# Patient Record
Sex: Female | Born: 1955 | Race: White | Hispanic: No | Smoking: Current every day smoker
Health system: Southern US, Community
[De-identification: ages and names within clinical notes are randomized; demographics above are authoritative.]

## PROBLEM LIST (undated history)

## (undated) DIAGNOSIS — F3160 Bipolar disorder, current episode mixed, unspecified: Secondary | ICD-10-CM

## (undated) DIAGNOSIS — M5412 Radiculopathy, cervical region: Secondary | ICD-10-CM

## (undated) DIAGNOSIS — F329 Major depressive disorder, single episode, unspecified: Secondary | ICD-10-CM

## (undated) DIAGNOSIS — M1990 Unspecified osteoarthritis, unspecified site: Secondary | ICD-10-CM

## (undated) DIAGNOSIS — F32A Depression, unspecified: Secondary | ICD-10-CM

## (undated) DIAGNOSIS — M5090 Cervical disc disorder, unspecified, unspecified cervical region: Secondary | ICD-10-CM

## (undated) DIAGNOSIS — F4010 Social phobia, unspecified: Secondary | ICD-10-CM

## (undated) DIAGNOSIS — G4700 Insomnia, unspecified: Secondary | ICD-10-CM

## (undated) DIAGNOSIS — M48061 Spinal stenosis, lumbar region without neurogenic claudication: Secondary | ICD-10-CM

## (undated) DIAGNOSIS — Z62810 Personal history of physical and sexual abuse in childhood: Secondary | ICD-10-CM

## (undated) DIAGNOSIS — I639 Cerebral infarction, unspecified: Secondary | ICD-10-CM

## (undated) DIAGNOSIS — J449 Chronic obstructive pulmonary disease, unspecified: Secondary | ICD-10-CM

## (undated) DIAGNOSIS — I10 Essential (primary) hypertension: Secondary | ICD-10-CM

## (undated) DIAGNOSIS — F1911 Other psychoactive substance abuse, in remission: Secondary | ICD-10-CM

## (undated) DIAGNOSIS — G43909 Migraine, unspecified, not intractable, without status migrainosus: Secondary | ICD-10-CM

## (undated) HISTORY — PX: TUBAL LIGATION: SHX77

## (undated) HISTORY — DX: Bipolar disorder, current episode mixed, unspecified: F31.60

## (undated) HISTORY — DX: Unspecified osteoarthritis, unspecified site: M19.90

## (undated) HISTORY — PX: NASAL SINUS SURGERY: SHX719

## (undated) HISTORY — DX: Chronic obstructive pulmonary disease, unspecified: J44.9

## (undated) HISTORY — DX: Cerebral infarction, unspecified: I63.9

## (undated) HISTORY — DX: Migraine, unspecified, not intractable, without status migrainosus: G43.909

## (undated) HISTORY — DX: Insomnia, unspecified: G47.00

## (undated) HISTORY — DX: Other psychoactive substance abuse, in remission: F19.11

## (undated) HISTORY — DX: Essential (primary) hypertension: I10

## (undated) HISTORY — DX: Cervical disc disorder, unspecified, unspecified cervical region: M50.90

## (undated) HISTORY — DX: Radiculopathy, cervical region: M54.12

## (undated) HISTORY — PX: ECTOPIC PREGNANCY SURGERY: SHX613

## (undated) HISTORY — DX: Personal history of physical and sexual abuse in childhood: Z62.810

## (undated) HISTORY — DX: Depression, unspecified: F32.A

## (undated) HISTORY — PX: CHOLECYSTECTOMY: SHX55

## (undated) HISTORY — DX: Spinal stenosis, lumbar region without neurogenic claudication: M48.061

---
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
## 2000-10-21 ENCOUNTER — Ambulatory Visit (HOSPITAL_COMMUNITY): Admission: RE | Admit: 2000-10-21 | Discharge: 2000-10-21 | Payer: Self-pay | Admitting: *Deleted

## 2001-03-10 ENCOUNTER — Inpatient Hospital Stay (HOSPITAL_COMMUNITY): Admission: RE | Admit: 2001-03-10 | Discharge: 2001-03-13 | Payer: Self-pay | Admitting: Neurosurgery

## 2001-03-10 ENCOUNTER — Encounter: Payer: Self-pay | Admitting: Neurosurgery

## 2005-09-25 ENCOUNTER — Ambulatory Visit: Payer: Self-pay | Admitting: Cardiology

## 2005-12-09 ENCOUNTER — Ambulatory Visit: Payer: Self-pay | Admitting: Cardiology

## 2005-12-11 ENCOUNTER — Ambulatory Visit (HOSPITAL_COMMUNITY): Admission: RE | Admit: 2005-12-11 | Discharge: 2005-12-11 | Payer: Self-pay | Admitting: Internal Medicine

## 2005-12-11 ENCOUNTER — Ambulatory Visit: Payer: Self-pay | Admitting: Internal Medicine

## 2006-01-14 ENCOUNTER — Encounter: Admission: RE | Admit: 2006-01-14 | Discharge: 2006-02-05 | Payer: Self-pay | Admitting: Unknown Physician Specialty

## 2006-05-05 ENCOUNTER — Ambulatory Visit: Payer: Self-pay | Admitting: Cardiology

## 2006-06-10 ENCOUNTER — Ambulatory Visit: Payer: Self-pay | Admitting: Cardiology

## 2007-11-30 ENCOUNTER — Encounter: Admission: RE | Admit: 2007-11-30 | Discharge: 2007-11-30 | Payer: Self-pay | Admitting: Neurosurgery

## 2008-06-06 ENCOUNTER — Emergency Department (HOSPITAL_COMMUNITY): Admission: EM | Admit: 2008-06-06 | Discharge: 2008-06-06 | Payer: Self-pay | Admitting: Emergency Medicine

## 2009-04-24 ENCOUNTER — Encounter: Admission: RE | Admit: 2009-04-24 | Discharge: 2009-07-23 | Payer: Self-pay | Admitting: Unknown Physician Specialty

## 2010-07-30 ENCOUNTER — Encounter: Admission: RE | Admit: 2010-07-30 | Discharge: 2010-10-28 | Payer: Self-pay | Source: Home / Self Care

## 2011-03-22 NOTE — Cardiovascular Report (Signed)
Karen Dougherty, Karen Dougherty              ACCOUNT NO.:  [id number]   MEDICAL RECORD NO.:  [id number]          PATIENT TYPE:  OIB   LOCATION:  2899                         FACILITY:  [facility]   PHYSICIAN:  Arvilla Meres, M.D. [facility] OF BIRTH:  Sep 24, 1956   DATE OF PROCEDURE:  12/11/2005  DATE OF DISCHARGE:                              CARDIAC CATHETERIZATION   Primary care physician is Dr. Virgina Organ.  Cardiologist is Jonelle Sidle,  M.D.   PATIENT IDENTIFICATION:  Karen Dougherty is a 55 year old woman with multiple  cardiac risk factors, who is having ongoing chest pain.  This is not related  to activity and is actually fairly typical.  The patient underwent a  Cardiolite study, which did not show any evidence of ischemia.  However,  given her ongoing pain she was quite concerned about the possibility of  coronary disease and thus she is referred for cardiac catheterization.   PROCEDURE PERFORMED:  1.  Selective coronary angiography.  2.  Left heart catheterization.  3.  Left ventriculogram.  4.  Angio-Seal femoral artery closure device.   DESCRIPTION OF PROCEDURE:  The risks and benefits of procedure were  explained to Karen Dougherty.  Consent was signed and placed on the chart.  Given her history of a DYE allergy, she was premedicated in the standard  fashion.  A 6-French arterial sheath was placed in the right femoral artery  using a modified Seldinger technique.  Standard preformed Judkins JL-4, JR-4  and angled pigtail catheters were used for the procedure, all catheter  exchanges made over a wire.  There were no apparent complications.   Central aortic pressure was 99/67 with a mean of 83.  LV was 104/6 with an  LVEDP of 15.  There was no AS.   Left main was normal.   LAD was a long vessel wrapping the apex.  It gave off a moderate-sized  diagonal, there was no coronary artery disease.   Left circumflex was a large system.  It gave off a large OM-1, a small OM-2  and a large  OM-3.  There was no coronary artery disease.   Right coronary artery was a large, dominant vessel that gave off an acute  marginal branch, a small PDA and three small PLs.  There was no coronary  artery disease.   Left ventriculogram done the RAO position showed an EF of 65% with no wall  motion abnormalities or significant mitral regurgitation.   ASSESSMENT:  1.  Normal coronary arteries.  2.  Normal left ventricular function.   PLAN/DISCUSSION:  Given her normal coronary anatomy, i doubt that her chest  pain is cardiac in nature.  I would continue with risk factor management and  follow-up with her primary care physician for further evaluation of her  chest discomfort.      Arvilla Meres, M.D. [facility]  Electronically Signed     DB/MEDQ  D:  12/11/2005  T:  12/11/2005  Job:  [id number]   cc:   Dr. Pauline Aus, M.D. [facility]  [address]  [place]  [place]  [address] 

## 2011-03-22 NOTE — H&P (Signed)
[place]. [facility]  Patient:    Karen Dougherty, Karen Dougherty                       MRN: [id number] Adm. Date:  [id number] Attending:  Coletta Memos                         History and Physical  ADDENDUM:  Ms. Bresnan did have what appeared to be a reaction to the contrast.  She did develop hives and erythema along the upper portion of her trunk, and itching.  She was given Benadryl for this.  It was soon afterwards that she was noted to have had a seizure.  At that point she was given Ativan. She had been given Valium pre-myelogram for anxiety. DD:  03/10/01 TD:  03/10/01 Job: [id number]

## 2011-03-22 NOTE — Discharge Summary (Signed)
[place]. [facility]  Patient:    Karen Dougherty, Karen Dougherty                       MRN: [id number] Adm. Date:  [id number] Disc. Date: [id number] Attending:  Coletta Memos                           Discharge Summary  ADMISSION DIAGNOSIS:  Possible seizure, status post lumbar puncture for myelogram.  DISCHARGE DIAGNOSIS:  Seizures, unknown etiology.  HISTORY OF PRESENT ILLNESS:  Karen Dougherty is a 55 year old woman who came to the hospital for an outpatient myelogram to evaluate neck and left upper extremity pain.  After a myelogram was performed, she developed a rash in the upper trunk and in the upper extremities along with some hives.  She also had what appeared to be a seizure witnessed by the nurses and by myself.  This involved decreased level of consciousness, inability to follow commands and rhythmic jerking of the right upper extremity.  That was not necessarily the case for the other seizure, which I was not there for.  HOSPITAL COURSE:  She was treated with Ativan and brought to the intensive care unit.  The seizures did stop.  She has not had any of these events since Mar 10, 2001.  She is on Depakote for bipolar disorder and the level on the Depakote was over 110 and well within therapeutic levels.  Electrolytes were checked and they were normal.  CT was checked and showed no masses or abnormalities.  She did have contrast in the subarachnoid space.  She does take and antidepressant of Celexa.  She was evaluated by the neurology service and Dr. Sharene Skeans.  She had an EEG performed which did not show any abnormalities.  It is not clear if these are seizures or pseudoseizures.  She was instructed by Dr. Sharene Skeans to keep a log of any activities.  I will let her cool off for approximately two weeks and then we will discuss scheduling surgery.  The myelogram did show decreased filling of the left C7 nerve root which was suspected on the basis of an MRI.  Greater  detail was warranted and that is why I elected to do the myelogram.  It is therefore not clear that she had a contrast reaction, although in the future, I think it best that she be treated with a steroid prep prior to having any contrast given IV.DD:  03/13/01 TD:  03/16/01 Job: [id number]

## 2011-03-22 NOTE — Consult Note (Signed)
[place]. [facility]  Patient:    Karen Dougherty, Karen Dougherty                       MRN: [id number] Proc. Date: 03/10/01 Adm. Date:  [id number] Attending:  Coletta Memos CC:         Mena Goes. Franky Macho, M.D.   Consultation Report  DATE OF BIRTH:  1956-07-14  CHIEF COMPLAINT:  Seizure.  HISTORY OF PRESENT ILLNESS:  Marisal Conaty is a 55 year old woman admitted by Dr. Franky Macho for CT scan myelogram.  The patient has had problems with pain in her arms and neck and was evaluated with a CT myelogram that showed evidence of cervical spondylosis at the C6-7 level beginning initially at C4-5, extending down to C7-T1, maximal at C6-7.  There was no block.  There was no evidence of significant herniated disk and I cannot tell if this is just a spondylitic change or represents some disk material that is laterally placed.  The patient has had a number of other medical problems including a seizure previously and withdrawal from some anti-epileptic drug that was being given for pain.  She says that she also has "little seizures."  When I asked her to describe that further, she told me that she could not and that I would have to ask her sister, who is not available at this time.  REVIEW OF SYSTEMS:  Review of systems is similarly limited.  I believe from the history that I have obtained that she has migraines, depression, and chronic pain.  I am unaware of any other medical problems at this time.  CURRENT MEDICATIONS: 1. Depakote. 2. Celexa. 3. Trazodone. 4. Voltaren. 5. Ultram.  ALLERGIES:  CODEINE.  Today, following the myelogram, the patient developed itching and then erythema, hives, and was treated with Benadryl.  Shortly thereafter, she had a seizure.  Dr. Franky Macho described it as jerking in the right upper extremity with altered mental status.  She was taken down to CT scan, which was normal, other than showing significant contrast dye within the subarachnoid  space. There was no sign of tumor, stroke, hemorrhage, or alteration in parenchymal structure.  The patient was treated with Ativan and remained quite sleepy.  She has had borderline hypotension; however, her vital signs have stabilized and she has begun to awaken at the time that I examined her.  SOCIAL HISTORY/FAMILY HISTORY:  Unknown to me and will have to be obtained later.  PHYSICAL EXAMINATION:  VITAL SIGNS:  Temperature 97.4, blood pressure 104/60, resting pulse 64, respirations 14.  Pulse oximetry 98% (2 L oxygen).  HEENT:  Normal tympanic membranes, pharynx, and conjunctivae.  NECK:  Some pain in flexion and extension without meningismus.  There were no bruits.  LUNGS:  Clear.  HEART:  No murmurs.  Pulses normal.  ABDOMEN:  Soft.  Bowel sounds normal.  EXTREMITIES:  No edema or cyanosis.  NEUROLOGIC:  Mental status:  The patient was awake, lethargic.  She speaks in short sentences and follows simple commands.  Pupils equal, round and reactive to light.  Fundi normal.  Visual visual fields full to count fingers. Extraocular movements are full.  Symmetric facial strength.  Midline tongue and uvula.  Air conduction greater than bone conduction bilaterally.  Motor examination:  Poor effort, which works out to 4-/5 generally.  She is able to move all of her fingers and her toes.  There is no focality to her examination.  Sensation showed withdrawal x  4.  Deep tendon reflexes are absent.  She had bilateral flexor-plantar responses.  IMPRESSION: 1. Seizure, not definitely epilepsy, 780.39.  I cannot tell if this was a    focal seizure, although Dr. Joesphine Bare notes suggested that there might have    been some focality.  On the basis of his expert evaluation, it would appear    that she had a seizure as opposed to a pseudoseizure, although when she    tells me that she has had other little seizures, I am just not certain    whether we are dealing with pseudoseizure  condition or a person with    complex partial seizures with secondary generalization that under the    stimulus of Omnipaque in a person taking Celexa lowered seizure threshold. 2. Cervical spondylosis c6-7, left greater than right. 3. Migraine disorder. 4. Chronic pain disorder. 5. Depression/anxiety. 6. Allergic reaction to OMNIPAQUE.  PLAN: 1. EEG in the morning. 2. Check Depakote level tonight. 3. Serum prolactin level if the patient has another seizure.  This needs to be    obtained within 20 minutes of the seizure.  Except for the Depakote, I    would hold off on any epileptic drugs for now and I would continue her    other medications for now.  I appreciate the opportunity to see her and will continue to follow along with you. DD:  03/10/01 TD:  03/11/01 Job: [id number]

## 2011-06-12 DIAGNOSIS — E785 Hyperlipidemia, unspecified: Secondary | ICD-10-CM | POA: Insufficient documentation

## 2011-06-12 DIAGNOSIS — F319 Bipolar disorder, unspecified: Secondary | ICD-10-CM | POA: Insufficient documentation

## 2011-06-12 DIAGNOSIS — Z8673 Personal history of transient ischemic attack (TIA), and cerebral infarction without residual deficits: Secondary | ICD-10-CM | POA: Insufficient documentation

## 2011-06-12 DIAGNOSIS — G43909 Migraine, unspecified, not intractable, without status migrainosus: Secondary | ICD-10-CM | POA: Insufficient documentation

## 2011-08-02 LAB — DIFFERENTIAL
Basophils Relative: 1
Eosinophils Absolute: 0.3
Eosinophils Relative: 3
Lymphs Abs: 2.1
Monocytes Relative: 6
Neutrophils Relative %: 73

## 2011-08-02 LAB — POCT I-STAT, CHEM 8
Calcium, Ion: 1.07 — ABNORMAL LOW
Chloride: 106
Creatinine, Ser: 1.1
Glucose, Bld: 82
HCT: 42
Potassium: 3.7

## 2011-08-02 LAB — CBC
HCT: 41.5
MCHC: 33.7
MCV: 98.4
Platelets: 349

## 2011-08-02 LAB — PROTIME-INR
INR: 0.9
Prothrombin Time: 12

## 2011-10-01 DIAGNOSIS — M4802 Spinal stenosis, cervical region: Secondary | ICD-10-CM | POA: Insufficient documentation

## 2015-03-16 DIAGNOSIS — B182 Chronic viral hepatitis C: Secondary | ICD-10-CM | POA: Insufficient documentation

## 2016-08-22 DIAGNOSIS — K219 Gastro-esophageal reflux disease without esophagitis: Secondary | ICD-10-CM | POA: Insufficient documentation

## 2016-08-22 DIAGNOSIS — M1990 Unspecified osteoarthritis, unspecified site: Secondary | ICD-10-CM | POA: Insufficient documentation

## 2016-09-05 DIAGNOSIS — F1421 Cocaine dependence, in remission: Secondary | ICD-10-CM | POA: Insufficient documentation

## 2016-09-05 DIAGNOSIS — G894 Chronic pain syndrome: Secondary | ICD-10-CM | POA: Insufficient documentation

## 2016-09-09 LAB — HM HIV SCREENING LAB: HM HIV Screening: NEGATIVE

## 2020-03-31 ENCOUNTER — Ambulatory Visit (INDEPENDENT_AMBULATORY_CARE_PROVIDER_SITE_OTHER): Payer: Medicaid Other | Admitting: Family Medicine

## 2020-03-31 ENCOUNTER — Encounter: Payer: Self-pay | Admitting: Family Medicine

## 2020-03-31 ENCOUNTER — Other Ambulatory Visit: Payer: Self-pay

## 2020-03-31 VITALS — BP 144/96 | HR 90 | Temp 97.9°F | Ht 61.0 in | Wt 143.4 lb

## 2020-03-31 DIAGNOSIS — Z8673 Personal history of transient ischemic attack (TIA), and cerebral infarction without residual deficits: Secondary | ICD-10-CM

## 2020-03-31 DIAGNOSIS — E782 Mixed hyperlipidemia: Secondary | ICD-10-CM

## 2020-03-31 DIAGNOSIS — Z1211 Encounter for screening for malignant neoplasm of colon: Secondary | ICD-10-CM

## 2020-03-31 DIAGNOSIS — Z1231 Encounter for screening mammogram for malignant neoplasm of breast: Secondary | ICD-10-CM

## 2020-03-31 DIAGNOSIS — G894 Chronic pain syndrome: Secondary | ICD-10-CM

## 2020-03-31 DIAGNOSIS — G40909 Epilepsy, unspecified, not intractable, without status epilepticus: Secondary | ICD-10-CM

## 2020-03-31 DIAGNOSIS — B182 Chronic viral hepatitis C: Secondary | ICD-10-CM | POA: Diagnosis not present

## 2020-03-31 DIAGNOSIS — J449 Chronic obstructive pulmonary disease, unspecified: Secondary | ICD-10-CM | POA: Diagnosis not present

## 2020-03-31 DIAGNOSIS — G43109 Migraine with aura, not intractable, without status migrainosus: Secondary | ICD-10-CM

## 2020-03-31 DIAGNOSIS — I10 Essential (primary) hypertension: Secondary | ICD-10-CM | POA: Diagnosis not present

## 2020-03-31 DIAGNOSIS — Z23 Encounter for immunization: Secondary | ICD-10-CM

## 2020-03-31 DIAGNOSIS — K219 Gastro-esophageal reflux disease without esophagitis: Secondary | ICD-10-CM

## 2020-03-31 MED ORDER — SHINGRIX 50 MCG/0.5ML IM SUSR: 0.5000 mL | Freq: Once | INTRAMUSCULAR | 0 refills | Status: AC

## 2020-03-31 MED ORDER — ENALAPRIL MALEATE 20 MG PO TABS: 20.0000 mg | ORAL_TABLET | Freq: Every day | ORAL | 2 refills | Status: DC

## 2020-03-31 MED ORDER — TETANUS-DIPHTH-ACELL PERTUSSIS 5-2.5-18.5 LF-MCG/0.5 IM SUSP: 0.5000 mL | Freq: Once | INTRAMUSCULAR | 0 refills | Status: AC

## 2020-03-31 MED ORDER — TOPIRAMATE 50 MG PO TABS
ORAL_TABLET | ORAL | 2 refills | Status: DC
Start: 2020-03-31 — End: 2020-05-10

## 2020-03-31 MED ORDER — FAMOTIDINE 20 MG PO TABS: 20.0000 mg | ORAL_TABLET | Freq: Two times a day (BID) | ORAL | 2 refills | Status: AC | PRN

## 2020-03-31 MED ORDER — SUMATRIPTAN SUCCINATE 50 MG PO TABS: 50.0000 mg | ORAL_TABLET | ORAL | 2 refills | Status: AC | PRN

## 2020-03-31 MED ORDER — ALBUTEROL SULFATE HFA 108 (90 BASE) MCG/ACT IN AERS: 2.0000 | INHALATION_SPRAY | Freq: Four times a day (QID) | RESPIRATORY_TRACT | 2 refills | Status: AC | PRN

## 2020-03-31 MED ORDER — SIMVASTATIN 20 MG PO TABS: 20.0000 mg | ORAL_TABLET | Freq: Every day | ORAL | 1 refills | Status: AC

## 2020-03-31 MED ORDER — BUDESONIDE-FORMOTEROL FUMARATE 160-4.5 MCG/ACT IN AERO: 2.0000 | INHALATION_SPRAY | Freq: Two times a day (BID) | RESPIRATORY_TRACT | 2 refills | Status: DC

## 2020-03-31 NOTE — Patient Instructions (Signed)
DASH Eating Plan DASH stands for "Dietary Approaches to Stop Hypertension." The DASH eating plan is a healthy eating plan that has been shown to reduce high blood pressure (hypertension). It may also reduce your risk for type 2 diabetes, heart disease, and stroke. The DASH eating plan may also help with weight loss. What are tips for following this plan?  General guidelines  Avoid eating more than 2,300 mg (milligrams) of salt (sodium) a day. If you have hypertension, you may need to reduce your sodium intake to 1,500 mg a day.  Limit alcohol intake to no more than 1 drink a day for nonpregnant women and 2 drinks a day for men. One drink equals 12 oz of beer, 5 oz of wine, or 1 oz of hard liquor.  Work with your health care provider to maintain a healthy body weight or to lose weight. Ask what an ideal weight is for you.  Get at least 30 minutes of exercise that causes your heart to beat faster (aerobic exercise) most days of the week. Activities may include walking, swimming, or biking.  Work with your health care provider or diet and nutrition specialist (dietitian) to adjust your eating plan to your individual calorie needs. Reading food labels   Check food labels for the amount of sodium per serving. Choose foods with less than 5 percent of the Daily Value of sodium. Generally, foods with less than 300 mg of sodium per serving fit into this eating plan.  To find whole grains, look for the word "whole" as the first word in the ingredient list. Shopping  Buy products labeled as "low-sodium" or "no salt added."  Buy fresh foods. Avoid canned foods and premade or frozen meals. Cooking  Avoid adding salt when cooking. Use salt-free seasonings or herbs instead of table salt or sea salt. Check with your health care provider or pharmacist before using salt substitutes.  Do not fry foods. Cook foods using healthy methods such as baking, boiling, grilling, and broiling instead.  Cook with  heart-healthy oils, such as olive, canola, soybean, or sunflower oil. Meal planning  Eat a balanced diet that includes: ? 5 or more servings of fruits and vegetables each day. At each meal, try to fill half of your plate with fruits and vegetables. ? Up to 6-8 servings of whole grains each day. ? Less than 6 oz of lean meat, poultry, or fish each day. A 3-oz serving of meat is about the same size as a deck of cards. One egg equals 1 oz. ? 2 servings of low-fat dairy each day. ? A serving of nuts, seeds, or beans 5 times each week. ? Heart-healthy fats. Healthy fats called Omega-3 fatty acids are found in foods such as flaxseeds and coldwater fish, like sardines, salmon, and mackerel.  Limit how much you eat of the following: ? Canned or prepackaged foods. ? Food that is high in trans fat, such as fried foods. ? Food that is high in saturated fat, such as fatty meat. ? Sweets, desserts, sugary drinks, and other foods with added sugar. ? Full-fat dairy products.  Do not salt foods before eating.  Try to eat at least 2 vegetarian meals each week.  Eat more home-cooked food and less restaurant, buffet, and fast food.  When eating at a restaurant, ask that your food be prepared with less salt or no salt, if possible. What foods are recommended? The items listed may not be a complete list. Talk with your dietitian about   what dietary choices are best for you. Grains Whole-grain or whole-wheat bread. Whole-grain or whole-wheat pasta. Brown rice. Oatmeal. Quinoa. Bulgur. Whole-grain and low-sodium cereals. Pita bread. Low-fat, low-sodium crackers. Whole-wheat flour tortillas. Vegetables Fresh or frozen vegetables (raw, steamed, roasted, or grilled). Low-sodium or reduced-sodium tomato and vegetable juice. Low-sodium or reduced-sodium tomato sauce and tomato paste. Low-sodium or reduced-sodium canned vegetables. Fruits All fresh, dried, or frozen fruit. Canned fruit in natural juice (without  added sugar). Meat and other protein foods Skinless chicken or turkey. Ground chicken or turkey. Pork with fat trimmed off. Fish and seafood. Egg whites. Dried beans, peas, or lentils. Unsalted nuts, nut butters, and seeds. Unsalted canned beans. Lean cuts of beef with fat trimmed off. Low-sodium, lean deli meat. Dairy Low-fat (1%) or fat-free (skim) milk. Fat-free, low-fat, or reduced-fat cheeses. Nonfat, low-sodium ricotta or cottage cheese. Low-fat or nonfat yogurt. Low-fat, low-sodium cheese. Fats and oils Soft margarine without trans fats. Vegetable oil. Low-fat, reduced-fat, or light mayonnaise and salad dressings (reduced-sodium). Canola, safflower, olive, soybean, and sunflower oils. Avocado. Seasoning and other foods Herbs. Spices. Seasoning mixes without salt. Unsalted popcorn and pretzels. Fat-free sweets. What foods are not recommended? The items listed may not be a complete list. Talk with your dietitian about what dietary choices are best for you. Grains Baked goods made with fat, such as croissants, muffins, or some breads. Dry pasta or rice meal packs. Vegetables Creamed or fried vegetables. Vegetables in a cheese sauce. Regular canned vegetables (not low-sodium or reduced-sodium). Regular canned tomato sauce and paste (not low-sodium or reduced-sodium). Regular tomato and vegetable juice (not low-sodium or reduced-sodium). Pickles. Olives. Fruits Canned fruit in a light or heavy syrup. Fried fruit. Fruit in cream or butter sauce. Meat and other protein foods Fatty cuts of meat. Ribs. Fried meat. Bacon. Sausage. Bologna and other processed lunch meats. Salami. Fatback. Hotdogs. Bratwurst. Salted nuts and seeds. Canned beans with added salt. Canned or smoked fish. Whole eggs or egg yolks. Chicken or turkey with skin. Dairy Whole or 2% milk, cream, and half-and-half. Whole or full-fat cream cheese. Whole-fat or sweetened yogurt. Full-fat cheese. Nondairy creamers. Whipped toppings.  Processed cheese and cheese spreads. Fats and oils Butter. Stick margarine. Lard. Shortening. Ghee. Bacon fat. Tropical oils, such as coconut, palm kernel, or palm oil. Seasoning and other foods Salted popcorn and pretzels. Onion salt, garlic salt, seasoned salt, table salt, and sea salt. Worcestershire sauce. Tartar sauce. Barbecue sauce. Teriyaki sauce. Soy sauce, including reduced-sodium. Steak sauce. Canned and packaged gravies. Fish sauce. Oyster sauce. Cocktail sauce. Horseradish that you find on the shelf. Ketchup. Mustard. Meat flavorings and tenderizers. Bouillon cubes. Hot sauce and Tabasco sauce. Premade or packaged marinades. Premade or packaged taco seasonings. Relishes. Regular salad dressings. Where to find more information:  National Heart, Lung, and Blood Institute: www.nhlbi.nih.gov  American Heart Association: www.heart.org Summary  The DASH eating plan is a healthy eating plan that has been shown to reduce high blood pressure (hypertension). It may also reduce your risk for type 2 diabetes, heart disease, and stroke.  With the DASH eating plan, you should limit salt (sodium) intake to 2,300 mg a day. If you have hypertension, you may need to reduce your sodium intake to 1,500 mg a day.  When on the DASH eating plan, aim to eat more fresh fruits and vegetables, whole grains, lean proteins, low-fat dairy, and heart-healthy fats.  Work with your health care provider or diet and nutrition specialist (dietitian) to adjust your eating plan to your   individual calorie needs. This information is not intended to replace advice given to you by your health care provider. Make sure you discuss any questions you have with your health care provider. Document Revised: 10/03/2017 Document Reviewed: 10/14/2016 Elsevier Patient Education  2020 [employer]  

## 2020-03-31 NOTE — Progress Notes (Signed)
New Patient Office Visit  Assessment & Plan:  1. Essential hypertension with goal blood pressure less than 130/80 - Uncontrolled.  Started patient on enalapril 20 mg once daily.  Education provided on the DASH diet. - enalapril (VASOTEC) 20 MG tablet; Take 1 tablet (20 mg total) by mouth daily.  Dispense: 30 tablet; Refill: 2  2. Chronic obstructive pulmonary disease, unspecified COPD type ([place]) - Uncontrolled.  Started patient on Symbicort 2 puffs twice daily.  Also prescribed albuterol inhaler for rescue. - albuterol (VENTOLIN HFA) 108 (90 Base) MCG/ACT inhaler; Inhale 2 puffs into the lungs every 6 (six) hours as needed for wheezing or shortness of breath.  Dispense: 18 g; Refill: 2 - budesonide-formoterol (SYMBICORT) 160-4.5 MCG/ACT inhaler; Inhale 2 puffs into the lungs 2 (two) times daily.  Dispense: 1 Inhaler; Refill: 2  3. Chronic hepatitis C without hepatic coma ([place]) - Ambulatory referral to Gastroenterology  4. Migraine with aura and without status migrainosus, not intractable - Uncontrolled.  Restarted patient's Topamax and Imitrex. - topiramate (TOPAMAX) 50 MG tablet; Take 25 mg (1/2 tablet) by mouth once daily x1 week, increase by 25 mg once weekly until you get to 50 mg twice daily.  Dispense: 60 tablet; Refill: 2 - SUMAtriptan (IMITREX) 50 MG tablet; Take 1 tablet (50 mg total) by mouth every 2 (two) hours as needed for migraine. May repeat in 2 hours if headache persists or recurs.  Dispense: 10 tablet; Refill: 2  5. Gastroesophageal reflux disease, unspecified whether esophagitis present - Uncontrolled.  Prescribed famotidine 20 mg twice daily as needed. - famotidine (PEPCID) 20 MG tablet; Take 1 tablet (20 mg total) by mouth 2 (two) times daily as needed for heartburn or indigestion.  Dispense: 60 tablet; Refill: 2  6. Chronic pain syndrome - Ambulatory referral to Pain Clinic  7-8. Mixed hyperlipidemia/H/O: stroke - Likely uncontrolled.  No recent labs.  Restarted  simvastatin today.  Did not obtain lipid panel today since patient was not fasting.  I did asked that she come fasting for her next appointment. - simvastatin (ZOCOR) 20 MG tablet; Take 1 tablet (20 mg total) by mouth at bedtime.  Dispense: 90 tablet; Refill: 1  9. Seizure disorder ([place]) - No seizures in at least the past 7 years.  Patient was not restarted on previous medications that she has been off of for at least the past 3 years.  10. Encounter for screening mammogram for malignant neoplasm of breast - MM DIGITAL SCREENING BILATERAL; Future  11. Immunization due - Tdap (BOOSTRIX) 5-2.5-18.5 LF-MCG/0.5 injection; Inject 0.5 mLs into the muscle once for 1 dose.  Dispense: 0.5 mL; Refill: 0 - SHINGRIX injection; Inject 0.5 mLs into the muscle once for 1 dose.  Dispense: 0.5 mL; Refill: 0  12. Colon cancer screening - Ambulatory referral to Gastroenterology   Follow-up: Return in about 6 weeks (around 05/12/2020) for follow-up of chronic medication conditions.   Hendricks Limes, MSN, APRN, FNP-C [facility]  Subjective:  Patient ID: Karen Dougherty, female    DOB: [birth year]  Age: 64 y.o. MRN: [id number]  Patient Care Team: Loman Brooklyn, FNP as PCP - General (Family Medicine)  CC:  Chief Complaint  Patient presents with  . New Patient (Initial Visit)    HPI Karen Dougherty presents to establish care. Patient just got out of jail.  Patient has a history of hepatitis C that was never treated.  She was supposed to be treated prior to going to jail a  few years ago.  She is interested in a referral for treatment.  Patient reports she has a history of seizures but has not had seizure medication in over 3 years.  She reports her last seizure was in either 2013 or 2014 when she had a stroke.  She does not wish to go back on medication for seizures.  Patient reports she was treated in the ER yesterday due to elevated blood pressure greater than  200/100.  Prior to being in jail she was taking enalapril, which she states she tolerated well.  Patient has a history of migraines and reports they have been terrible without medication over the past few years.  They were well controlled previously with Topamax 50 mg twice daily for prevention and Imitrex 50 mg as needed for abortive treatment.  She has COPD and reports experiencing wheezing and shortness of breath on a daily basis.  She has no inhalers at home.  History of acid reflux for which she used to take Zantac for.  She states it is not so bad anymore but she does occasionally have issues.   Review of Systems  Constitutional: Negative for chills, fever, malaise/fatigue and weight loss.  HENT: Negative for congestion, ear discharge, ear pain, nosebleeds, sinus pain, sore throat and tinnitus.   Eyes: Negative for blurred vision, double vision, pain, discharge and redness.  Respiratory: Positive for shortness of breath and wheezing. Negative for cough.   Cardiovascular: Negative for chest pain, palpitations and leg swelling.  Gastrointestinal: Negative for abdominal pain, constipation, diarrhea, heartburn, nausea and vomiting.  Genitourinary: Negative for dysuria, frequency and urgency.  Musculoskeletal: Negative for myalgias.  Skin: Negative for rash.  Neurological: Positive for headaches. Negative for dizziness, seizures and weakness.  Psychiatric/Behavioral: Negative for depression, substance abuse and suicidal ideas. The patient is not nervous/anxious.     Current Outpatient Medications:  .  albuterol (VENTOLIN HFA) 108 (90 Base) MCG/ACT inhaler, Inhale 2 puffs into the lungs every 6 (six) hours as needed for wheezing or shortness of breath., Disp: 18 g, Rfl: 2 .  budesonide-formoterol (SYMBICORT) 160-4.5 MCG/ACT inhaler, Inhale 2 puffs into the lungs 2 (two) times daily., Disp: 1 Inhaler, Rfl: 2 .  enalapril (VASOTEC) 20 MG tablet, Take 1 tablet (20 mg total) by mouth daily.,  Disp: 30 tablet, Rfl: 2 .  famotidine (PEPCID) 20 MG tablet, Take 1 tablet (20 mg total) by mouth 2 (two) times daily as needed for heartburn or indigestion., Disp: 60 tablet, Rfl: 2 .  SHINGRIX injection, Inject 0.5 mLs into the muscle once for 1 dose., Disp: 0.5 mL, Rfl: 0 .  simvastatin (ZOCOR) 20 MG tablet, Take 1 tablet (20 mg total) by mouth at bedtime., Disp: 90 tablet, Rfl: 1 .  SUMAtriptan (IMITREX) 50 MG tablet, Take 1 tablet (50 mg total) by mouth every 2 (two) hours as needed for migraine. May repeat in 2 hours if headache persists or recurs., Disp: 10 tablet, Rfl: 2 .  Tdap (BOOSTRIX) 5-2.5-18.5 LF-MCG/0.5 injection, Inject 0.5 mLs into the muscle once for 1 dose., Disp: 0.5 mL, Rfl: 0 .  topiramate (TOPAMAX) 50 MG tablet, Take 25 mg (1/2 tablet) by mouth once daily x1 week, increase by 25 mg once weekly until you get to 50 mg twice daily., Disp: 60 tablet, Rfl: 2  Allergies  Allergen Reactions  . Codeine Itching    HURTS IN HER CHEST   . Iodinated Diagnostic Agents     CAUSED SEIZURE ACTIVITY  . Latex Rash  Past Medical History:  Diagnosis Date  . Bipolar affective disorder, mixed ([facility])   . Cervical disc disorder   . Cervical radiculopathy   . COPD (chronic obstructive pulmonary disease) ([facility])   . Depression   . Hypertension   . Insomnia   . Lumbar spinal stenosis   . Migraine   . Osteoarthritis   . Stroke [facility])     Past Surgical History:  Procedure Laterality Date  . CHOLECYSTECTOMY    . ECTOPIC PREGNANCY SURGERY    . NASAL SINUS SURGERY      Family History  Problem Relation Age of Onset  . Diabetes Mother   . Hypertension Mother   . Diabetes Father   . Alcohol abuse Sister   . Drug abuse Sister   . Asthma Sister   . Lupus Sister   . Cancer Niece     Social History   Socioeconomic History  . Marital status: Divorced    Spouse name: Not on file  . Number of children: Not on file  . Years of education: Not on file  . Highest education level:  Not on file  Occupational History  . Not on file  Tobacco Use  . Smoking status: Current Every Day Smoker    Packs/day: 3.00    Years: 49.00    Pack years: 147.00    Types: Cigarettes  . Smokeless tobacco: Never Used  Substance and Sexual Activity  . Alcohol use: Not Currently  . Drug use: Yes    Frequency: 2.0 times per week    Types: Marijuana    Comment: 2 "JOINTS" WEEKLY  . Sexual activity: Yes  Other Topics Concern  . Not on file  Social History Narrative  . Not on file   Social Determinants of Health   Financial Resource Strain:   . Difficulty of Paying Living Expenses:   Food Insecurity:   . Worried About Programme researcher, broadcasting/film/video in the Last Year:   . Barista in the Last Year:   Transportation Needs:   . Freight forwarder (Medical):   Marland Kitchen Lack of Transportation (Non-Medical):   Physical Activity:   . Days of Exercise per Week:   . Minutes of Exercise per Session:   Stress:   . Feeling of Stress :   Social Connections:   . Frequency of Communication with Friends and Family:   . Frequency of Social Gatherings with Friends and Family:   . Attends Religious Services:   . Active Member of Clubs or Organizations:   . Attends Banker Meetings:   Marland Kitchen Marital Status:   Intimate Partner Violence:   . Fear of Current or Ex-Partner:   . Emotionally Abused:   Marland Kitchen Physically Abused:   . Sexually Abused:     Objective:   Today's Vitals: BP (!) 144/96   Pulse 90   Temp 97.9 F (36.6 C) (Temporal)   Ht 5\' 1"  (1.549 m)   Wt 143 lb 6.4 oz (65 kg)   LMP 03/31/2008 (Approximate)   BMI 27.10 kg/m   Physical Exam Vitals reviewed.  Constitutional:      General: She is not in acute distress.    Appearance: Normal appearance. She is overweight. She is not ill-appearing, toxic-appearing or diaphoretic.  HENT:     Head: Normocephalic and atraumatic.  Eyes:     General: No scleral icterus.       Right eye: No discharge.        Left eye:  No discharge.       Conjunctiva/sclera: Conjunctivae normal.  Cardiovascular:     Rate and Rhythm: Normal rate and regular rhythm.     Heart sounds: Normal heart sounds. No murmur. No friction rub. No gallop.   Pulmonary:     Effort: Pulmonary effort is normal. No respiratory distress.     Breath sounds: Normal breath sounds. No stridor. No wheezing, rhonchi or rales.  Musculoskeletal:        General: Normal range of motion.     Cervical back: Normal range of motion.  Skin:    General: Skin is warm and dry.     Capillary Refill: Capillary refill takes less than 2 seconds.  Neurological:     General: No focal deficit present.     Mental Status: She is alert and oriented to person, place, and time. Mental status is at baseline.  Psychiatric:        Mood and Affect: Mood normal.        Behavior: Behavior normal.        Thought Content: Thought content normal.        Judgment: Judgment normal.

## 2020-04-04 ENCOUNTER — Telehealth: Payer: Self-pay | Admitting: Family Medicine

## 2020-04-04 MED ORDER — PROMETHAZINE HCL 12.5 MG PO TABS: 12.5000 mg | ORAL_TABLET | Freq: Three times a day (TID) | ORAL | 0 refills | Status: AC | PRN

## 2020-04-04 NOTE — Telephone Encounter (Incomplete)
°  Incoming Patient Call  04/04/2020  What symptoms do you have? Patient has had migraine headache since 5 AM this morning and she took Sumatriptan 50 mg early this morning and took Topiramate 50 mg two hours ago and is throwing and needs some phenagan called in to help her   How long have you been sick? This morning  Have you been seen for this problem? Yes  If your provider decides to give you a prescription, which pharmacy would you like for it to be sent to? ***[facility] in [place]   Patient informed that this information will be sent to the clinical staff for review and that they should receive a follow up call.

## 2020-04-04 NOTE — Telephone Encounter (Signed)
Patient aware and verbalized understanding. °

## 2020-04-04 NOTE — Telephone Encounter (Signed)
Phenergan sent

## 2020-04-05 ENCOUNTER — Encounter: Payer: Self-pay | Admitting: Internal Medicine

## 2020-05-01 ENCOUNTER — Encounter: Payer: Self-pay | Admitting: Family Medicine

## 2020-05-09 ENCOUNTER — Emergency Department (HOSPITAL_COMMUNITY)
Admission: EM | Admit: 2020-05-09 | Discharge: 2020-05-09 | Disposition: A | Discharge status: Home / Self Care | Payer: Medicaid Other | Attending: Emergency Medicine | Admitting: Emergency Medicine

## 2020-05-09 ENCOUNTER — Telehealth: Payer: Self-pay | Admitting: *Deleted

## 2020-05-09 ENCOUNTER — Emergency Department (HOSPITAL_COMMUNITY): Payer: Medicaid Other

## 2020-05-09 ENCOUNTER — Encounter (HOSPITAL_COMMUNITY): Payer: Self-pay | Admitting: *Deleted

## 2020-05-09 ENCOUNTER — Other Ambulatory Visit: Payer: Self-pay

## 2020-05-09 ENCOUNTER — Telehealth: Payer: Self-pay | Admitting: Family Medicine

## 2020-05-09 DIAGNOSIS — Z8673 Personal history of transient ischemic attack (TIA), and cerebral infarction without residual deficits: Secondary | ICD-10-CM | POA: Insufficient documentation

## 2020-05-09 DIAGNOSIS — M549 Dorsalgia, unspecified: Secondary | ICD-10-CM | POA: Insufficient documentation

## 2020-05-09 DIAGNOSIS — R202 Paresthesia of skin: Secondary | ICD-10-CM | POA: Insufficient documentation

## 2020-05-09 DIAGNOSIS — R072 Precordial pain: Secondary | ICD-10-CM | POA: Insufficient documentation

## 2020-05-09 DIAGNOSIS — J449 Chronic obstructive pulmonary disease, unspecified: Secondary | ICD-10-CM | POA: Insufficient documentation

## 2020-05-09 DIAGNOSIS — Z91040 Latex allergy status: Secondary | ICD-10-CM | POA: Diagnosis not present

## 2020-05-09 DIAGNOSIS — I10 Essential (primary) hypertension: Secondary | ICD-10-CM | POA: Diagnosis not present

## 2020-05-09 DIAGNOSIS — F439 Reaction to severe stress, unspecified: Secondary | ICD-10-CM

## 2020-05-09 DIAGNOSIS — R079 Chest pain, unspecified: Secondary | ICD-10-CM

## 2020-05-09 DIAGNOSIS — F17210 Nicotine dependence, cigarettes, uncomplicated: Secondary | ICD-10-CM | POA: Insufficient documentation

## 2020-05-09 DIAGNOSIS — R200 Anesthesia of skin: Secondary | ICD-10-CM

## 2020-05-09 DIAGNOSIS — Z733 Stress, not elsewhere classified: Secondary | ICD-10-CM | POA: Diagnosis not present

## 2020-05-09 HISTORY — DX: Social phobia, unspecified: F40.10

## 2020-05-09 LAB — BASIC METABOLIC PANEL
Anion gap: 11 (ref 5–15)
BUN: 8 mg/dL (ref 8–23)
CO2: 19 mmol/L — ABNORMAL LOW (ref 22–32)
Calcium: 9.5 mg/dL (ref 8.9–10.3)
Chloride: 112 mmol/L — ABNORMAL HIGH (ref 98–111)
Creatinine, Ser: 0.93 mg/dL (ref 0.44–1.00)
GFR calc Af Amer: >60 mL/min (ref >60)
GFR calc non Af Amer: >60 mL/min (ref >60)
Glucose, Bld: 90 mg/dL (ref 70–99)
Potassium: 3.4 mmol/L — ABNORMAL LOW (ref 3.5–5.1)
Sodium: 142 mmol/L (ref 135–145)

## 2020-05-09 LAB — CBC WITH DIFFERENTIAL/PLATELET
Abs Immature Granulocytes: 0.02 10*3/uL (ref 0.00–0.07)
Basophils Absolute: 0 10*3/uL (ref 0.0–0.1)
Basophils Relative: 0 %
Eosinophils Absolute: 0.2 10*3/uL (ref 0.0–0.5)
Eosinophils Relative: 3 %
HCT: 44.6 % (ref 36.0–46.0)
Hemoglobin: 14.8 g/dL (ref 12.0–15.0)
Immature Granulocytes: 0 %
Lymphocytes Relative: 37 %
Lymphs Abs: 2.1 10*3/uL (ref 0.7–4.0)
MCH: 31.4 pg (ref 26.0–34.0)
MCHC: 33.2 g/dL (ref 30.0–36.0)
MCV: 94.7 fL (ref 80.0–100.0)
Monocytes Absolute: 0.4 10*3/uL (ref 0.1–1.0)
Monocytes Relative: 7 %
Neutro Abs: 3.1 10*3/uL (ref 1.7–7.7)
Neutrophils Relative %: 53 %
Platelets: 262 10*3/uL (ref 150–400)
RBC: 4.71 MIL/uL (ref 3.87–5.11)
RDW: 13.2 % (ref 11.5–15.5)
WBC: 5.9 10*3/uL (ref 4.0–10.5)
nRBC: 0 % (ref 0.0–0.2)

## 2020-05-09 LAB — TROPONIN I (HIGH SENSITIVITY)
Troponin I (High Sensitivity): 3 ng/L (ref <18)
Troponin I (High Sensitivity): 3 ng/L (ref <18)

## 2020-05-09 NOTE — Telephone Encounter (Signed)
Returned patient's phone call.  Patient states that she is having some chest discomfort and "twinges".  BP Reading 183/117, 192/128.  Patient has no aspirin or nitro on hand.  Advised patient with ongoing elevation in BP and chest discomfort that she needs to call EMS.  Patient verbalized understanding and states she will call

## 2020-05-09 NOTE — ED Triage Notes (Signed)
Pt brought in by [facility] with c/o headache, increased BP (200/110 per pt), chest pain described as someone squeezing her and "electrical pain" in left arm. Pain 6/10. EKG normal per EMS. Initial BP 193/119. Pt was given 324 ASA. 3 Nitro sprays. CP decreased to 3/10. Headache worsened. Increased stress recently. Slurred speech with lip tingling this morning, gone now. EMS reports negative stroke screen.

## 2020-05-09 NOTE — Discharge Instructions (Addendum)
You were seen in the emergency department for evaluation of elevated blood pressures and chest pain that was associated with some tingling around her mouth and some difficulty speaking.  You had blood work EKG chest x-ray and a CAT scan of your head that did not show any serious findings.  This will require close follow-up with your primary care doctor for continued management of blood pressure and your other symptoms.  Please return to the emergency department for any worsening or concerning symptoms.
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
## 2020-05-09 NOTE — Telephone Encounter (Signed)
Received phone call from patient stating that her BPs have been elevated 200s/120s and 170s/110s. Patient states about 2-3 days ago she had some numbess in lips, tingling down left arm, headache and some bladder leakage. Strongly advised patient to go to the ER to be evaluated, patient declines and says she is ok. BP while on the phone 183/108 74.  Patient continued to decline going to the ER to be evaluated.  Patient states that she has an appt tomorrow morning with Deliah Boston, FNP but is unsure if she will be able to make due to transportation.  Patient uses [employer] and they need a 5 day notice before appt.  Patient will call back to this office to let us know if she will be able to make it

## 2020-05-10 ENCOUNTER — Encounter: Payer: Self-pay | Admitting: Family Medicine

## 2020-05-10 ENCOUNTER — Ambulatory Visit (INDEPENDENT_AMBULATORY_CARE_PROVIDER_SITE_OTHER): Payer: Medicaid Other | Admitting: Family Medicine

## 2020-05-10 ENCOUNTER — Ambulatory Visit: Payer: Medicaid Other | Admitting: Family Medicine

## 2020-05-10 VITALS — BP 140/98 | HR 79 | Temp 98.1°F | Ht 61.0 in | Wt 141.0 lb

## 2020-05-10 DIAGNOSIS — J449 Chronic obstructive pulmonary disease, unspecified: Secondary | ICD-10-CM

## 2020-05-10 DIAGNOSIS — I10 Essential (primary) hypertension: Secondary | ICD-10-CM | POA: Diagnosis not present

## 2020-05-10 DIAGNOSIS — Z0000 Encounter for general adult medical examination without abnormal findings: Secondary | ICD-10-CM

## 2020-05-10 DIAGNOSIS — G43109 Migraine with aura, not intractable, without status migrainosus: Secondary | ICD-10-CM | POA: Diagnosis not present

## 2020-05-10 DIAGNOSIS — E785 Hyperlipidemia, unspecified: Secondary | ICD-10-CM

## 2020-05-10 DIAGNOSIS — Z599 Problem related to housing and economic circumstances, unspecified: Secondary | ICD-10-CM

## 2020-05-10 DIAGNOSIS — B182 Chronic viral hepatitis C: Secondary | ICD-10-CM | POA: Diagnosis not present

## 2020-05-10 DIAGNOSIS — Z59819 Housing instability, housed unspecified: Secondary | ICD-10-CM

## 2020-05-10 MED ORDER — TOPIRAMATE 50 MG PO TABS: ORAL_TABLET | ORAL | 1 refills | Status: AC

## 2020-05-10 MED ORDER — ENALAPRIL MALEATE 20 MG PO TABS: 40.0000 mg | ORAL_TABLET | Freq: Every day | ORAL | 2 refills | Status: AC
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
## 2020-05-10 NOTE — Patient Instructions (Signed)

## 2020-05-11 LAB — CMP14+EGFR
ALT: 43 IU/L — ABNORMAL HIGH (ref 0–32)
AST: 32 IU/L (ref 0–40)
Albumin/Globulin Ratio: 1.7 (ref 1.2–2.2)
Albumin: 4.7 g/dL (ref 3.8–4.8)
Alkaline Phosphatase: 103 IU/L (ref 48–121)
BUN/Creatinine Ratio: 10 — ABNORMAL LOW (ref 12–28)
BUN: 11 mg/dL (ref 8–27)
Bilirubin Total: 0.4 mg/dL (ref 0.0–1.2)
CO2: 22 mmol/L (ref 20–29)
Calcium: 10 mg/dL (ref 8.7–10.3)
Chloride: 106 mmol/L (ref 96–106)
Creatinine, Ser: 1.14 mg/dL — ABNORMAL HIGH (ref 0.57–1.00)
GFR calc Af Amer: 60 mL/min/{1.73_m2} (ref >59)
GFR calc non Af Amer: 52 mL/min/{1.73_m2} — ABNORMAL LOW (ref >59)
Globulin, Total: 2.7 g/dL (ref 1.5–4.5)
Glucose: 87 mg/dL (ref 65–99)
Potassium: 4.3 mmol/L (ref 3.5–5.2)
Sodium: 141 mmol/L (ref 134–144)
Total Protein: 7.4 g/dL (ref 6.0–8.5)

## 2020-05-11 LAB — LIPID PANEL
Chol/HDL Ratio: 2.4 ratio (ref 0.0–4.4)
Cholesterol, Total: 107 mg/dL (ref 100–199)
HDL: 44 mg/dL (ref >39)
LDL Chol Calc (NIH): 46 mg/dL (ref 0–99)
Triglycerides: 86 mg/dL (ref 0–149)
VLDL Cholesterol Cal: 17 mg/dL (ref 5–40)

## 2020-05-11 LAB — VITAMIN D 25 HYDROXY (VIT D DEFICIENCY, FRACTURES): Vit D, 25-Hydroxy: 54.1 ng/mL (ref 30.0–100.0)

## 2020-05-13 ENCOUNTER — Encounter: Payer: Self-pay | Admitting: Family Medicine

## 2020-05-15 ENCOUNTER — Telehealth: Payer: Self-pay | Admitting: Family Medicine

## 2020-05-15 NOTE — Progress Notes (Signed)
Appointment canceled and patient aware.

## 2020-05-15 NOTE — Chronic Care Management (AMB) (Signed)
  Care Management   Note  05/15/2020 Name: Jenisis Harmsen MRN: [id number] DOB: 06-22-1956  [name] is a 64 y.o. year old female who is a primary care patient of Gwenlyn Fudge, FNP. I reached out to Vida Roller Quintanar by phone today in response to a referral sent by Ms. Vida Roller Santagata's health plan.    Ms. Dolney was given information about care management services today including:  1. Care management services include personalized support from designated clinical staff supervised by her physician, including individualized plan of care and coordination with other care providers 2. 24/7 contact phone numbers for assistance for urgent and routine care needs. 3. The patient may stop care management services at any time by phone call to the office staff.  Patient agreed to services and verbal consent obtained.   Follow up plan: Telephone appointment with care management team member scheduled for: 05/25/2020  [facility] Guide, Embedded Care Coordination [facility]  [place], [place] [address] Direct Dial: [phone] [email or web address]

## 2020-05-16 NOTE — Progress Notes (Signed)
Appt made for mobile unit for Aug 25 @ 10AM

## 2020-05-18 ENCOUNTER — Other Ambulatory Visit: Payer: Self-pay | Admitting: Family Medicine

## 2020-05-18 DIAGNOSIS — Z1231 Encounter for screening mammogram for malignant neoplasm of breast: Secondary | ICD-10-CM

## 2020-05-25 ENCOUNTER — Telehealth: Payer: Self-pay | Admitting: *Deleted

## 2020-05-25 ENCOUNTER — Ambulatory Visit: Payer: Medicaid Other | Admitting: Licensed Clinical Social Worker

## 2020-05-25 ENCOUNTER — Ambulatory Visit: Payer: Self-pay | Admitting: Gastroenterology

## 2020-05-25 ENCOUNTER — Ambulatory Visit: Payer: Medicaid Other | Admitting: *Deleted

## 2020-05-25 DIAGNOSIS — G40909 Epilepsy, unspecified, not intractable, without status epilepticus: Secondary | ICD-10-CM

## 2020-05-25 DIAGNOSIS — F419 Anxiety disorder, unspecified: Secondary | ICD-10-CM

## 2020-05-25 DIAGNOSIS — Z8673 Personal history of transient ischemic attack (TIA), and cerebral infarction without residual deficits: Secondary | ICD-10-CM

## 2020-05-25 DIAGNOSIS — F319 Bipolar disorder, unspecified: Secondary | ICD-10-CM

## 2020-05-25 DIAGNOSIS — F209 Schizophrenia, unspecified: Secondary | ICD-10-CM

## 2020-05-25 DIAGNOSIS — I10 Essential (primary) hypertension: Secondary | ICD-10-CM

## 2020-05-25 DIAGNOSIS — K219 Gastro-esophageal reflux disease without esophagitis: Secondary | ICD-10-CM

## 2020-05-25 DIAGNOSIS — G43109 Migraine with aura, not intractable, without status migrainosus: Secondary | ICD-10-CM

## 2020-05-25 DIAGNOSIS — G894 Chronic pain syndrome: Secondary | ICD-10-CM

## 2020-05-25 DIAGNOSIS — F1421 Cocaine dependence, in remission: Secondary | ICD-10-CM

## 2020-05-25 DIAGNOSIS — J449 Chronic obstructive pulmonary disease, unspecified: Secondary | ICD-10-CM

## 2020-05-25 DIAGNOSIS — E785 Hyperlipidemia, unspecified: Secondary | ICD-10-CM

## 2020-05-25 NOTE — Chronic Care Management (AMB) (Signed)
Chronic Care Management    Clinical Social Work Follow Up Note  05/25/2020 Name: Merisa Julio Rollison MRN: [id number] DOB: [birth year]  Vida Roller Larzelere is a 64 y.o. year old female who is a primary care patient of Gwenlyn Fudge, FNP. The CCM team was consulted for assistance with Mental Health Counseling and Resources.   Review of patient status, including review of consultants reports, other relevant assessments, and collaboration with appropriate care team members and the patient's provider was performed as part of comprehensive patient evaluation and provision of chronic care management services.    SDOH (Social Determinants of Health) assessments performed: Yes; risk for tobacco use; risk for depression; risk for stress; risk for transport needs  SDOH Interventions     Most Recent Value  SDOH Interventions  Depression Interventions/Treatment  --  [talked with client about RNCM support and about LCSW support]        Chronic Care Management from 05/25/2020 in [facility]  PHQ-9 Total Score 5     GAD 7 : Generalized Anxiety Score 05/25/2020 05/10/2020  Nervous, Anxious, on Edge 1 0  Control/stop worrying 1 0  Worry too much - different things 0 0  Trouble relaxing 0 0  Restless 0 0  Easily annoyed or irritable 0 2  Afraid - awful might happen 0 2  Total GAD 7 Score 2 4  Anxiety Difficulty Somewhat difficult -    Outpatient Encounter Medications as of 05/25/2020  Medication Sig   albuterol (VENTOLIN HFA) 108 (90 Base) MCG/ACT inhaler Inhale 2 puffs into the lungs every 6 (six) hours as needed for wheezing or shortness of breath.   budesonide-formoterol (SYMBICORT) 160-4.5 MCG/ACT inhaler Inhale 2 puffs into the lungs 2 (two) times daily.   enalapril (VASOTEC) 20 MG tablet Take 2 tablets (40 mg total) by mouth daily.   famotidine (PEPCID) 20 MG tablet Take 1 tablet (20 mg total) by mouth 2 (two) times daily as needed for heartburn or indigestion.    promethazine (PHENERGAN) 12.5 MG tablet Take 1 tablet (12.5 mg total) by mouth every 8 (eight) hours as needed for nausea or vomiting.   simvastatin (ZOCOR) 20 MG tablet Take 1 tablet (20 mg total) by mouth at bedtime. (Patient taking differently: Take 20 mg by mouth daily. )   SUMAtriptan (IMITREX) 50 MG tablet Take 1 tablet (50 mg total) by mouth every 2 (two) hours as needed for migraine. May repeat in 2 hours if headache persists or recurs.   topiramate (TOPAMAX) 50 MG tablet Take 75 mg (1.5 tablets) by mouth in the AM with 50 mg (1 tablet) in the PM x1 week, then increase to 75 mg (1.5 tablets) twice daily.   No facility-administered encounter medications on file as of 05/25/2020.     Goals Addressed              This Visit's Progress     Client will talk with LCSW in next 30 days to discuss health needs of client and mental health needs of client (pt-stated)        CARE PLAN ENTRY   Current Barriers:   Patient with chronic diagnoses of GERD, HTN, hx of stroke, COPD, Bipolar I disorder, Hx coaine dependence, HLD, IBS, Schizophrenia  Clinical Social Work Clinical Goal(s):   LCSW to call client in next 30 days to discuss health needs of client and to discuss mental health needs of client  Interventions:  Talked with client about housing situation of client (rents  apartment in [place], [place]. ) Talked with client about migraines of client Talked with client about her history of care received from psychiatrists Talked with Tata about her history of TIAs Talked with client about her receiving Medicaid benefit. Talked with client about family support (brother in law is supportive) Talked with client about transport needs Talked with client about sleeping issues of client Talked with client about appetite of client Talked with client about RNCM support with Demetrios Loll RN Talked with client about pain issues of client Provided counseling support for client Talked with  client about her upcoming court hearing, June 09, 2020 Talked with client about her history of legal charges Talked with client about her history of care at [facility] in [place], [place] Collaborated with [facility] regarding nursing needs of client  Patient Self Care Activities:   Completes ADLs independently Attends medical appointments   Patient Self Care Deficits:  Transport challenges Housing needs   Initial goal documentation       Follow Up Plan: LCSW to call client in next 4 weeks to talk with her about health needs of client and about mental health needs of client  Kelton Pillar.Symphonie Schneiderman MSW, LCSW Licensed Clinical Social Worker [facility] Care Management [phone]
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
## 2020-05-25 NOTE — Chronic Care Management (AMB) (Signed)
   Care Management   Note  05/25/2020 Name: Karen Dougherty MRN: [id number] DOB: [birth year]  Consulted by Lorna Few, LCSW regarding patient's desire for psychiatric services at [facility] in [place]. She has a diagnosis of bipolar affective disorder, schizophrenia, and social anxiety. She has seen [facility] in the past but would not like to be seen there again. She is specifically requesting a referral to the [facility] location if the provide psychiatric services.   Telephone message with this request sent to PCP, Deliah Boston, FNP for review and action.    Follow up plan: The care management team will reach out to the patient again over the next 30 days.   Demetrios Loll, BSN, RN-BC Embedded Chronic Care Manager [facility] / [facility] Care Management Direct Dial: [phone]

## 2020-05-25 NOTE — Patient Instructions (Addendum)
Licensed Clinical Social Worker Visit Information  Goals we discussed today:  Goals Addressed              This Visit's Progress   .  Client will talk with LCSW in next 30 days to discuss health needs of client and mental health needs of client (pt-stated)        CARE PLAN ENTRY   Current Barriers:  . Patient with chronic diagnoses of GERD, HTN, hx of stroke, COPD, Bipolar I disorder, Hx coaine dependence, HLD, IBS, Schizophrenia  Clinical Social Work Clinical Goal(s):  Marland Kitchen LCSW to call client in next 30 days to discuss health needs of client and to discuss mental health needs of client  Interventions:  Talked with client about housing situation of client (rents apartment in [place], [place]. ) Talked with client about CCM program support Talked with client about RNCM support.  Talked with client about migraines of client Talked with client about her history of care received from psychiatrists Talked with [name] about her history of TIAs Talked with client about her receiving Medicaid benefit. Talked with client about family support (brother in law is supportive) Talked with client about transport needs Talked with client about sleeping issues of client Talked with client about appetite of client Talked with client about RNCM support with Demetrios Loll RN Talked with client about pain issues of client Provided counseling support for client Talked with client about her upcoming court hearing, June 09, 2020 Talked with client about her history of legal charges   Patient Self Care Activities:   Completes ADLs independently Attends medical appointments   Patient Self Care Deficits:  Transport challenges Housing needs  Initial goal documentation       Materials Provided: No  Follow Up Plan: LCSW to call client in next 4 weeks to talk with her about health needs of client and about mental health needs of client  The patient verbalized understanding of instructions  provided today and declined a print copy of patient instruction materials.   Karen Dougherty MSW, LCSW Licensed Clinical Social Worker [facility] Care Management [phone]

## 2020-05-25 NOTE — Telephone Encounter (Signed)
She is already established with [facility] for pain management. Do they need another referral to see psych?

## 2020-05-29 NOTE — Telephone Encounter (Signed)
Referral placed.

## 2020-06-01 ENCOUNTER — Ambulatory Visit: Payer: Medicaid Other | Admitting: Family Medicine

## 2020-06-07 ENCOUNTER — Ambulatory Visit: Payer: Medicaid Other | Admitting: Family Medicine

## 2020-06-28 ENCOUNTER — Telehealth: Payer: Medicaid Other

## 2020-07-24 ENCOUNTER — Ambulatory Visit: Payer: Self-pay | Admitting: Gastroenterology

## 2020-08-03 ENCOUNTER — Telehealth: Payer: Self-pay | Admitting: *Deleted

## 2020-08-03 ENCOUNTER — Ambulatory Visit: Payer: Medicaid Other | Admitting: *Deleted

## 2020-08-03 ENCOUNTER — Ambulatory Visit: Payer: Medicaid Other | Admitting: Licensed Clinical Social Worker

## 2020-08-03 DIAGNOSIS — B182 Chronic viral hepatitis C: Secondary | ICD-10-CM

## 2020-08-03 DIAGNOSIS — F209 Schizophrenia, unspecified: Secondary | ICD-10-CM

## 2020-08-03 DIAGNOSIS — K219 Gastro-esophageal reflux disease without esophagitis: Secondary | ICD-10-CM

## 2020-08-03 DIAGNOSIS — Z8673 Personal history of transient ischemic attack (TIA), and cerebral infarction without residual deficits: Secondary | ICD-10-CM

## 2020-08-03 DIAGNOSIS — F1421 Cocaine dependence, in remission: Secondary | ICD-10-CM

## 2020-08-03 DIAGNOSIS — J449 Chronic obstructive pulmonary disease, unspecified: Secondary | ICD-10-CM

## 2020-08-03 DIAGNOSIS — E785 Hyperlipidemia, unspecified: Secondary | ICD-10-CM

## 2020-08-03 DIAGNOSIS — Z1211 Encounter for screening for malignant neoplasm of colon: Secondary | ICD-10-CM

## 2020-08-03 DIAGNOSIS — F319 Bipolar disorder, unspecified: Secondary | ICD-10-CM

## 2020-08-03 DIAGNOSIS — I10 Essential (primary) hypertension: Secondary | ICD-10-CM

## 2020-08-03 NOTE — Chronic Care Management (AMB) (Signed)
Chronic Care Management    Clinical Social Work Follow Up Note  08/03/2020 Name: Angelic Schnelle Ayler MRN: [id number] DOB: November 15, 1955  Vida Roller Arambula is a 64 y.o. year old female who is a primary care patient of Gwenlyn Fudge, FNP. The CCM team was consulted for assistance with [employer] .   Review of patient status, including review of consultants reports, other relevant assessments, and collaboration with appropriate care team members and the patient's provider was performed as part of comprehensive patient evaluation and provision of chronic care management services.    SDOH (Social Determinants of Health) assessments performed: No;risk for depression; risk for risk for tobacco use; risk for stress; risk for financial strain    Chronic Care Management from 05/25/2020 in [facility]  PHQ-9 Total Score 5       GAD 7 : Generalized Anxiety Score 05/25/2020 05/10/2020  Nervous, Anxious, on Edge 1 0  Control/stop worrying 1 0  Worry too much - different things 0 0  Trouble relaxing 0 0  Restless 0 0  Easily annoyed or irritable 0 2  Afraid - awful might happen 0 2  Total GAD 7 Score 2 4  Anxiety Difficulty Somewhat difficult -    Outpatient Encounter Medications as of 08/03/2020  Medication Sig  . albuterol (VENTOLIN HFA) 108 (90 Base) MCG/ACT inhaler Inhale 2 puffs into the lungs every 6 (six) hours as needed for wheezing or shortness of breath.  . budesonide-formoterol (SYMBICORT) 160-4.5 MCG/ACT inhaler Inhale 2 puffs into the lungs 2 (two) times daily.  . enalapril (VASOTEC) 20 MG tablet Take 2 tablets (40 mg total) by mouth daily.  . famotidine (PEPCID) 20 MG tablet Take 1 tablet (20 mg total) by mouth 2 (two) times daily as needed for heartburn or indigestion.  . promethazine (PHENERGAN) 12.5 MG tablet Take 1 tablet (12.5 mg total) by mouth every 8 (eight) hours as needed for nausea or vomiting.  . simvastatin (ZOCOR) 20 MG tablet Take 1  tablet (20 mg total) by mouth at bedtime. (Patient taking differently: Take 20 mg by mouth daily. )  . SUMAtriptan (IMITREX) 50 MG tablet Take 1 tablet (50 mg total) by mouth every 2 (two) hours as needed for migraine. May repeat in 2 hours if headache persists or recurs.  . topiramate (TOPAMAX) 50 MG tablet Take 75 mg (1.5 tablets) by mouth in the AM with 50 mg (1 tablet) in the PM x1 week, then increase to 75 mg (1.5 tablets) twice daily.   No facility-administered encounter medications on file as of 08/03/2020.     Goals    .  Client will talk with LCSW in next 30 days to discuss health needs of client and mental health needs of client (pt-stated)      CARE PLAN ENTRY   Current Barriers:  . Patient with chronic diagnoses of GERD, HTN, hx of stroke, COPD, Bipolar I disorder, Hx coaine dependence, HLD, IBS, Schizophrenia  Clinical Social Work Clinical Goal(s):  Marland Kitchen LCSW to call client in next 30 days to discuss health needs of client and to discuss mental health needs of client  Interventions:  Talked with client about housing situation of client (rents apartment in [place], [place]. ) Talked with client about CCM program support Talked with client about RNCM support.  Talked with client about migraines of client Talked with client about her receiving Medicaid benefit.( client now has Medicaid in [place], [place]) Talked with client about family support (brother  in law is supportive) Talked with client about transport needs Talked with client about sleeping issues of client Talked with client about appetite of client Talked with client about RNCM support with Demetrios Loll RN Talked with client about pain issues of client Provided counseling support for client Talked with client about her appointments at Pain Clinic in [place], [place] Talked with client about mood of client Collaborated with RNCM regarding nursing needs of client   Patient Self Care Activities:   Completes ADLs  independently Attends medical appointments   Patient Self Care Deficits:  Transport challenges Housing needs   Initial goal documentation       Follow Up Plan: LCSW to call client in next 4 weeks to talk with her about health needs of client and about mental health needs of client  Kelton Pillar.Orlan Aversa MSW, LCSW Licensed Clinical Social Worker [facility] Care Management [phone]

## 2020-08-03 NOTE — Patient Instructions (Addendum)
Licensed Clinical Child psychotherapist Visit Information  Goals we discussed today:    Client will talk with LCSW in next 30 days to discuss health needs of client and mental health needs of client (pt-stated)         CARE PLAN ENTRY   Current Barriers:   Patient with chronic diagnoses of GERD, HTN, hx of stroke, COPD, Bipolar I disorder, Hx coaine dependence, HLD, IBS, Schizophrenia  Clinical Social Work Clinical Goal(s):   LCSW to call client in next 30 days to discuss health needs of client and to discuss mental health needs of client  Interventions:  Talked with client about housing situation of client (rents apartment in [place], [place]. ) Talked with client about CCM program support Talked with client about RNCM support.  Talked with client about migraines of client Talked with client about her receiving Medicaid benefit.( client now has Medicaid in [place], [place]) Talked with client about family support (brother in law is supportive) Talked with client about transport needs Talked with client about sleeping issues of client Talked with client about appetite of client Talked with client about RNCM support with Demetrios Loll RN Talked with client about pain issues of client Provided counseling support for client Talked with client about her appointments at Pain Clinic in [place], [place] Talked with client about mood of client Collaborated with RNCM regarding nursing needs of client   Patient Self Care Activities:   Completes ADLs independently Attends medical appointments   Patient Self Care Deficits:  Transport challenges Housing needs   Initial goal documentation       Follow Up Plan: LCSW to call client in next 4 weeks to talk with her about health needs of client and about mental health needs of client  Materials Provided: No  The patient verbalized understanding of instructions provided today and declined a print copy of patient  instruction materials.   Kelton Pillar.Jermey Closs MSW, LCSW Licensed Clinical Social Worker [facility] Care Management [phone]

## 2020-08-03 NOTE — Telephone Encounter (Signed)
08/03/2020  Patient spoke with Lorna Few, LCSW this morning and he relays that she needs help rescheduling appointments since moving to [facility]. I have asked the front desk staff to reschedule her visit with Deliah Boston, FNP and her mammogram. She will also need to be scheduled with GI. The previous referral was closed by [facility] because she refused services due to moving.   I'm unsure if she can work off of the original referral or if she will need a new one. Forwarding to [facility] referral staff for assistance and PCP as FYI.  Referral details below.    Reason for Referral: Hep C & screening colonoscopy  Has the referral been discussed with the patient?: Yes  Designated contact for the referral if not the patient (name/phone number): [phone]  Has the patient seen a specialist for this issue before?: No  If so, who (practice/provider)? N/A  Does the patient have a provider or location preference for the referral?: [facility] Would the patient like to see previous specialist if applicable? N/A

## 2020-08-03 NOTE — Chronic Care Management (AMB) (Signed)
  Care Management   Care Coordination Note  08/03/2020 Name: Karen Dougherty MRN: [id number] DOB: 05/08/1956  Contacted by Lorna Few, LCSW regarding care coordination. He spoke with Karen Dougherty this morning and she needs assistance in rescheduling her follow-up and pap smear with PCP, mammogram, and GI appointment.   Collaborated with front office staff to have them contact Karen Dougherty to reschedule visit with PCP for f/u and pap and to reschedule mammogram on the mobile unit.   Message sent to referral coordinator and PCP advising that her referral to [facility] was cancelled because of her recent move to [facility]. She will need a new referral for a colonoscopy and to f/u on Hep C.  Follow up plan: Follow up with LCSW as planned Scheduled Initial telephone visit with RN Care Manager for 08/31/20  Demetrios Loll, BSN, RN-BC Embedded Chronic Care Manager [facility] / [facility] Care Management Direct Dial: [phone]

## 2020-08-03 NOTE — Patient Instructions (Signed)
appt scheduled with RN Care Manager for 08/31/20. Patient will be contacted to schedule mammogram, Pap, and GI referral.   Demetrios Loll, BSN, RN-BC Embedded Chronic Care Manager [facility] / [facility] Care Management Direct Dial: [phone]

## 2020-08-04 NOTE — Telephone Encounter (Signed)
Patient may prefer a location closer to her home in [place]. Thank you for your help!

## 2020-08-31 ENCOUNTER — Telehealth: Payer: Self-pay | Admitting: *Deleted

## 2020-08-31 ENCOUNTER — Telehealth: Payer: Medicaid Other | Admitting: *Deleted

## 2020-08-31 NOTE — Telephone Encounter (Signed)
   Care Management   Outreach Note  08/31/2020 Name: Karen Dougherty MRN: [id number] DOB: [birth year]  Referred by: Gwenlyn Fudge, FNP Reason for referral : Chronic Care Management (RN follow-up and complete initial RN intake)   An unsuccessful Telephone Visit was attempted today. The patient was referred to the case management team for assistance with care management and care coordination. [name] is established with Lorna Few, LCSW and I have spoken with her regarding care coordination. Per chart review, she still needs a f/u appointment with Deliah Boston, FNP and a mammogram scheduled. It does look like the GI referral to a provider in [place] has been done.   Clinical Goals: . Over the next 10 days, patient will be contacted by a Care Guide to reschedule their Initial CCM Visit . Over the next 30 days, patient will have an Initial CCM Visit with a member of the embedded CCM team to discuss self-management of their chronic medical conditions  Interventions and Plan . Chart reviewed in preparation for initial visit telephone call . Collaboration with other care team members as needed . Unsuccessful outreach to patient  . Reviewed upcoming appointments: LCSW on 09/08/20 . Patient may follow-up with RN Care Manager for care coordination as needed   Demetrios Loll, BSN, RN-BC Embedded Chronic Care Manager [facility] / [facility] Care Management Direct Dial: [phone]

## 2020-09-04 ENCOUNTER — Telehealth: Payer: Self-pay | Admitting: Family Medicine

## 2020-09-04 NOTE — Telephone Encounter (Signed)
Attempted to reach the patient and the alternative number listed for her daughter but there was no answer and no ability to leave a voicemail.  I am reaching out to her with regards to a form for urinary incontinence supplies.  Just wanted to verify that this is something that is needed as I could not find previous documentation in Karen Dougherty's notes  Should patient return the call, the forms are on my desk and I will be glad to complete them once I have this information.  Indy Kuck M. Nadine Counts, DO [facility]

## 2020-09-06 ENCOUNTER — Telehealth: Payer: Self-pay | Admitting: Family Medicine

## 2020-09-06 DIAGNOSIS — R151 Fecal smearing: Secondary | ICD-10-CM

## 2020-09-06 DIAGNOSIS — N3946 Mixed incontinence: Secondary | ICD-10-CM

## 2020-09-06 NOTE — Telephone Encounter (Signed)
Call to verify need for incontinence supplies since I could not find this in her record.  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  She also reports some fecal smearing.  She wears a small pull-up and does request that these be sent over for a refill.    Formed her that I would be glad to do this for her whilst her PCP is out on maternity leave.  Forms have been completed

## 2020-09-08 ENCOUNTER — Telehealth: Payer: Medicaid Other

## 2020-09-09 IMAGING — DX DG CHEST 1V PORT
1 series · 1 of 1 positions shown · non-contrast
Comparison: March 30, 2020

CLINICAL DATA: Left arm numbness.

EXAM:
PORTABLE CHEST 1 VIEW

[chest ap]
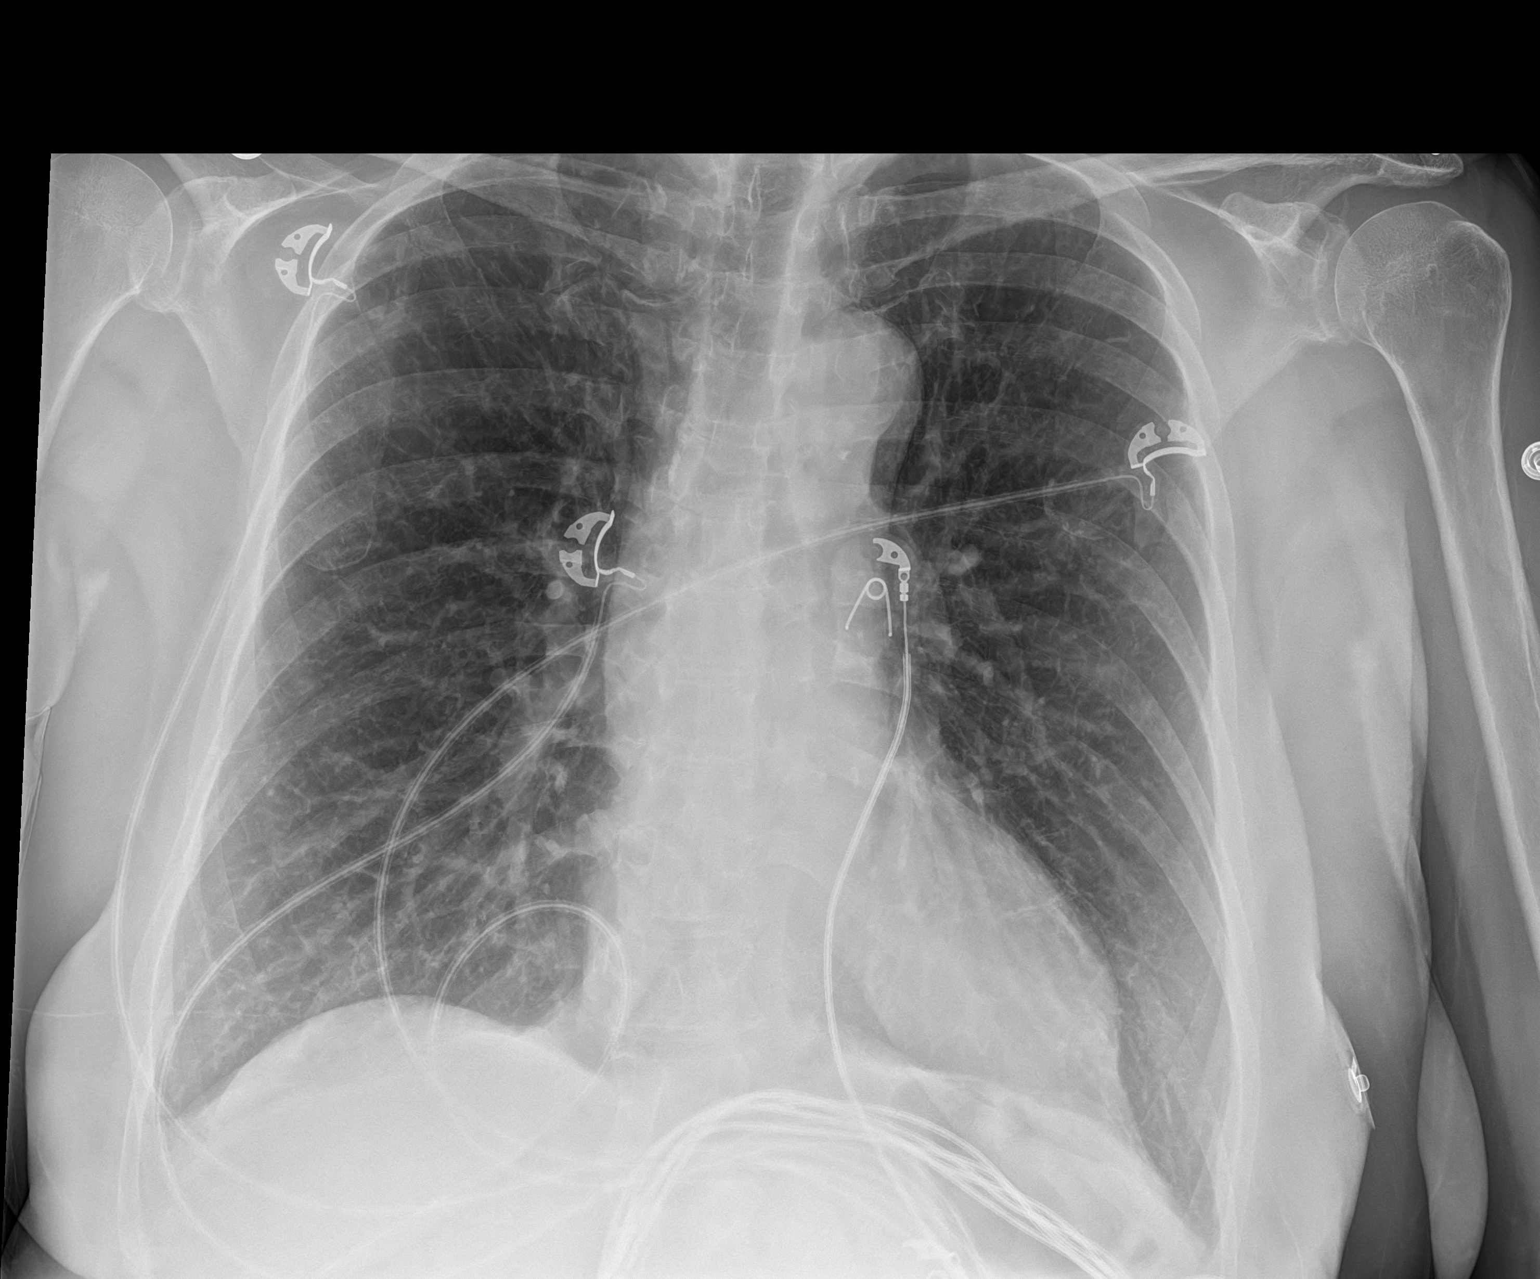

[1 of 1 positions shown; findings below may reference images not displayed]

FINDINGS: Mild, diffuse chronic appearing increased lung markings are seen
without evidence of acute infiltrate, pleural effusion or
pneumothorax. The heart size and mediastinal contours are within
normal limits. The visualized skeletal structures are unremarkable.
IMPRESSION: Chronic appearing increased lung markings without evidence of acute
or active cardiopulmonary disease.

## 2020-09-09 IMAGING — CT CT HEAD W/O CM
3 series · 16 of 47 positions shown, 19 images · non-contrast
Comparison: MRI 06/06/2008, CT brain report 11/10/2017

CLINICAL DATA: Chest pain slurred speech

EXAM:
CT HEAD WITHOUT CONTRAST
TECHNIQUE: Contiguous axial images were obtained from the base of the skull
through the vertex without intravenous contrast.

[Series 2: head w o · axial · 0.41mm/px · z∈[+56,+181]mm · 10 of 30 slices shown, 13 images]
[im 3/30  brain]
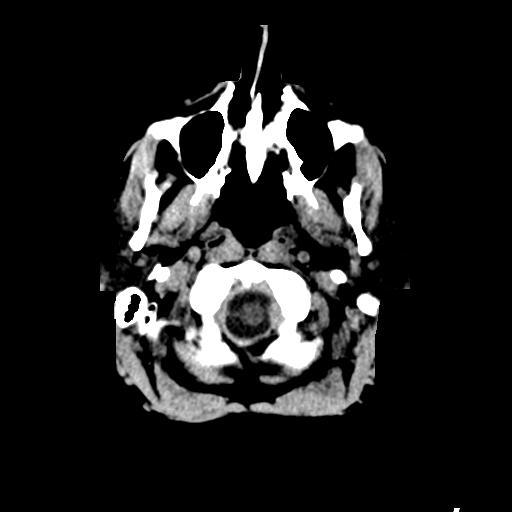
[im 3/30  bone]
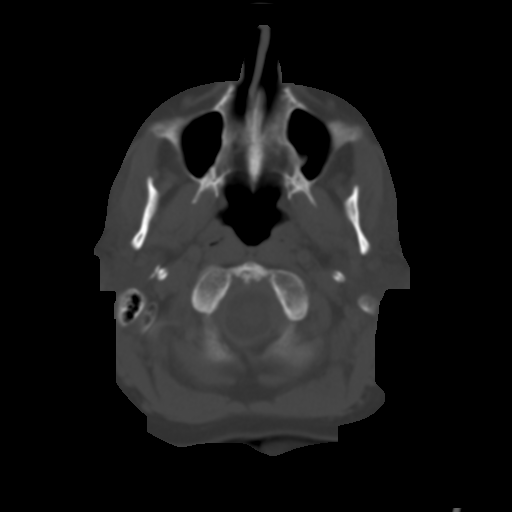
[im 6/30  brain]
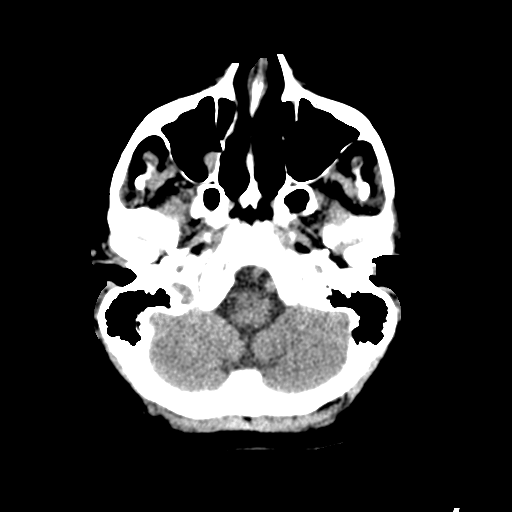
[im 9/30  brain]
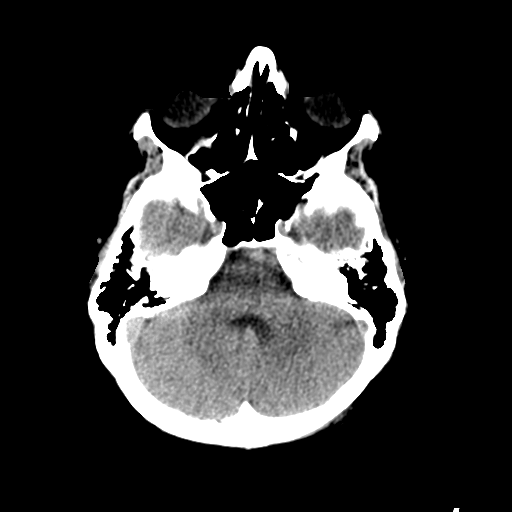
[im 11/30  brain]
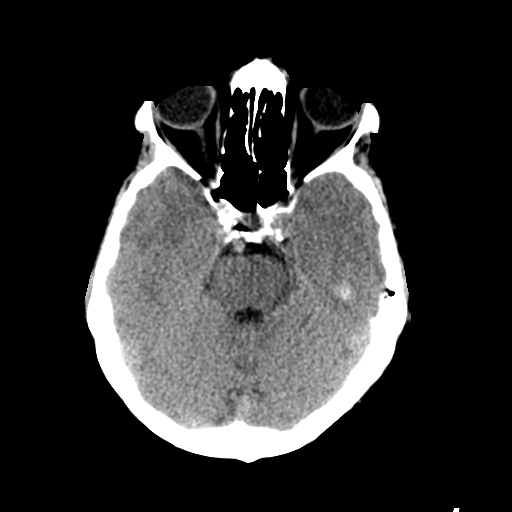
[im 14/30  brain]
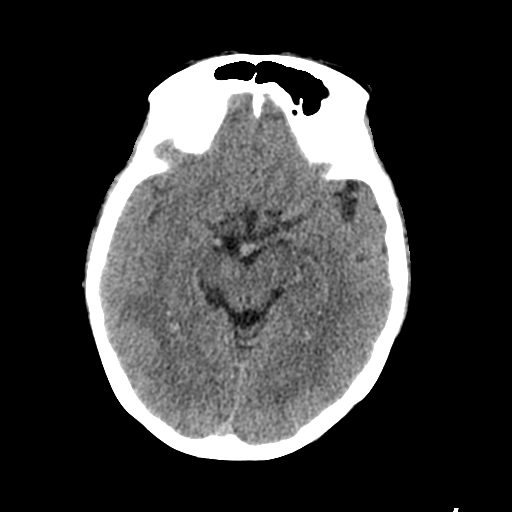
[im 14/30  bone]
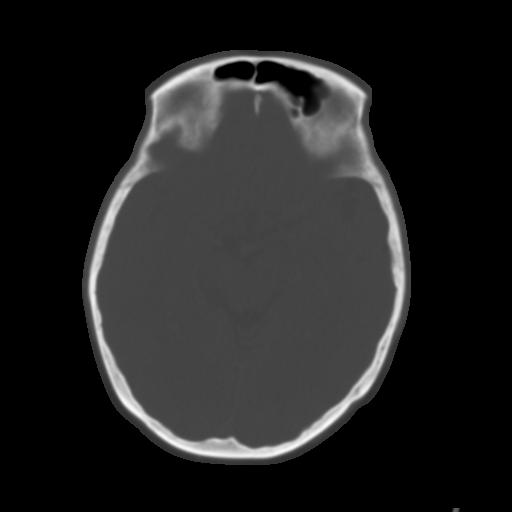
[im 17/30  brain]
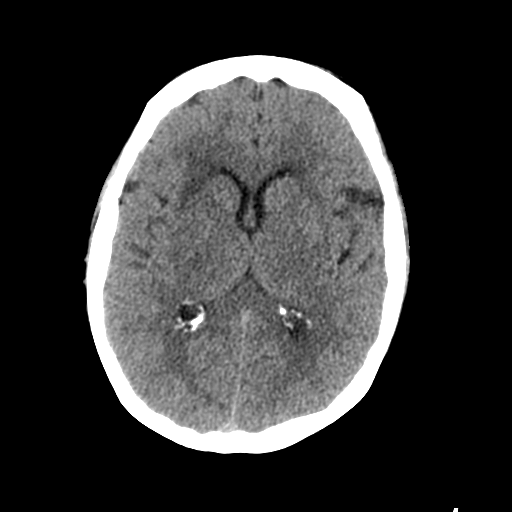
[im 20/30  brain]
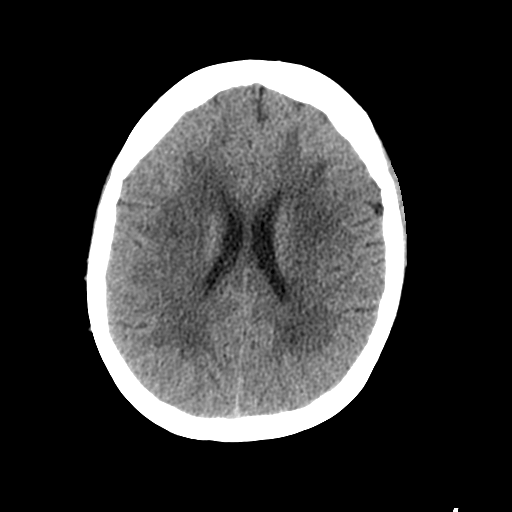
[im 23/30  brain]
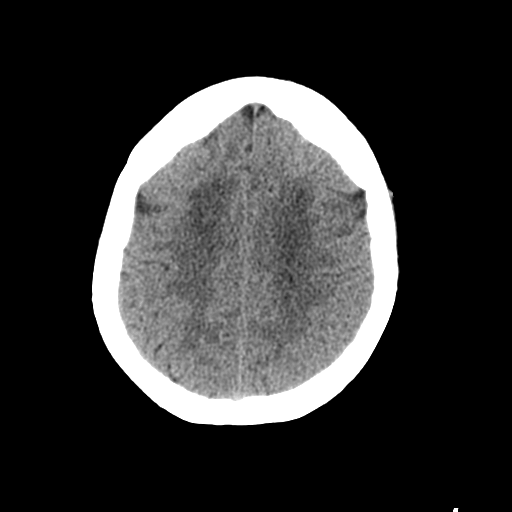
[im 25/30  brain]
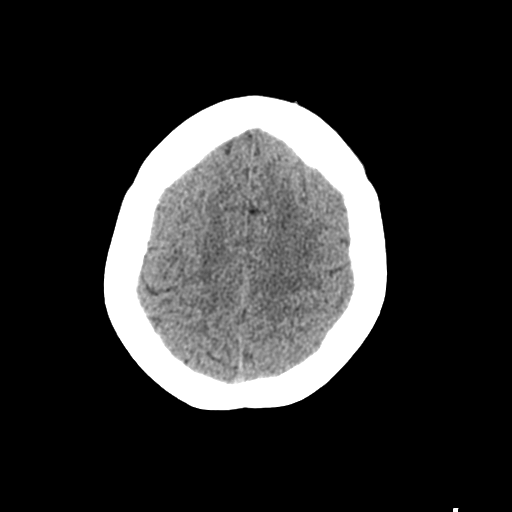
[im 25/30  bone]
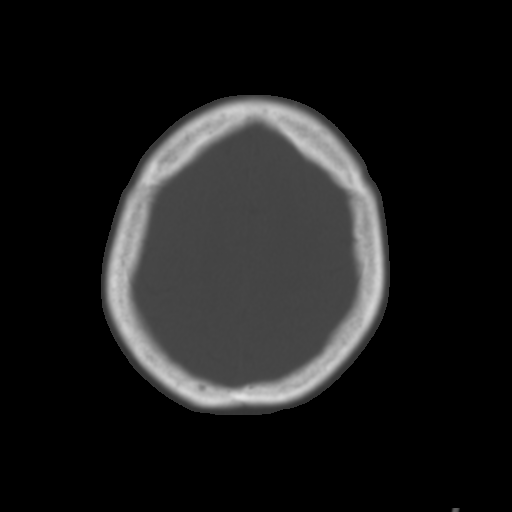
[im 28/30  brain]
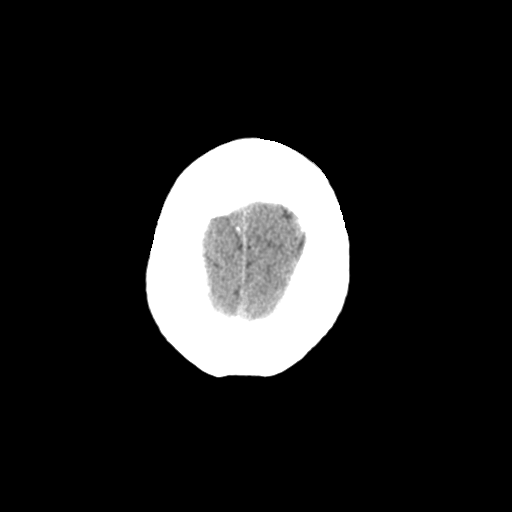

[Series 4: coronal soft · coronal · 0.33mm/px · 3 of 63 slices shown]
[im 21/63  brain]
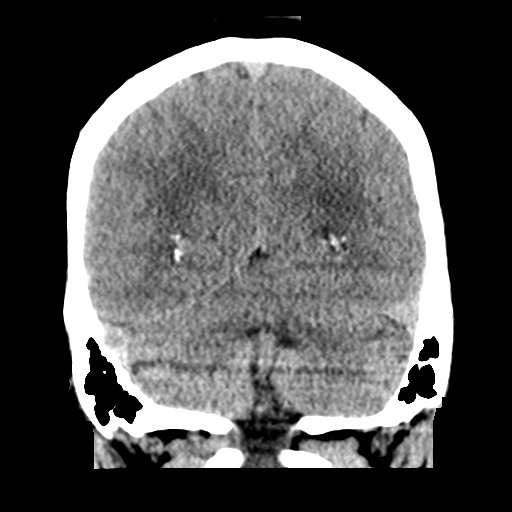
[im 28/63  brain]
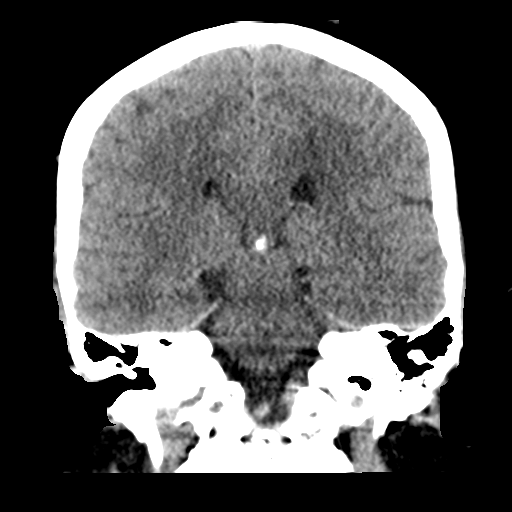
[im 35/63  brain]
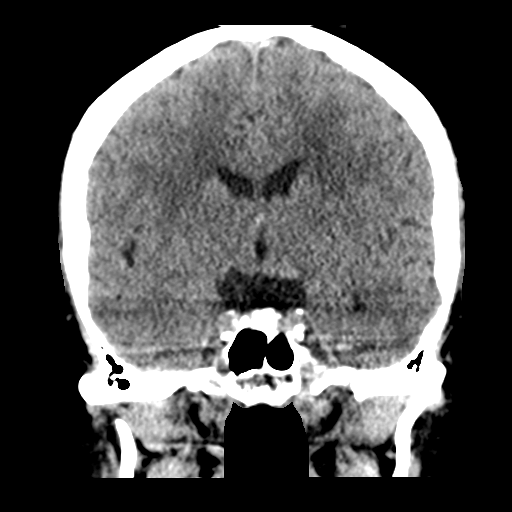

[Series 5: sagittal soft · sagittal · 0.30mm/px · 3 of 52 slices shown]
[im 18/52  brain]
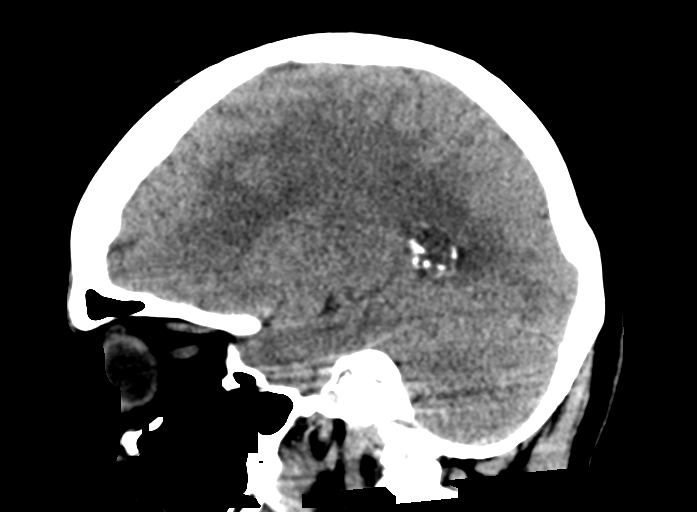
[im 26/52  brain]
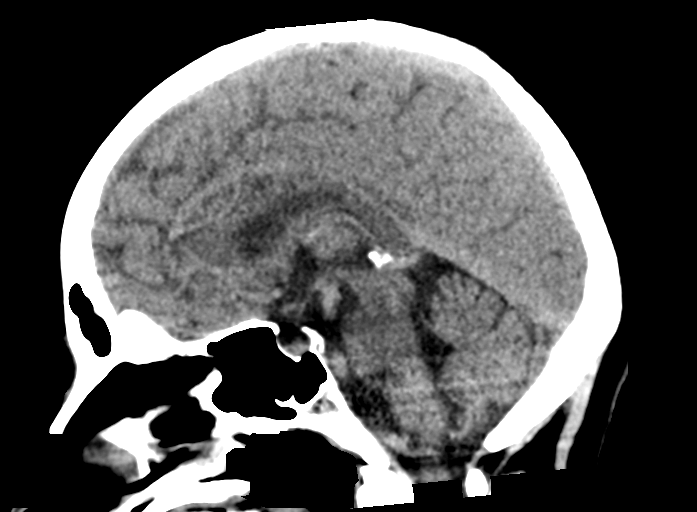
[im 35/52  brain]
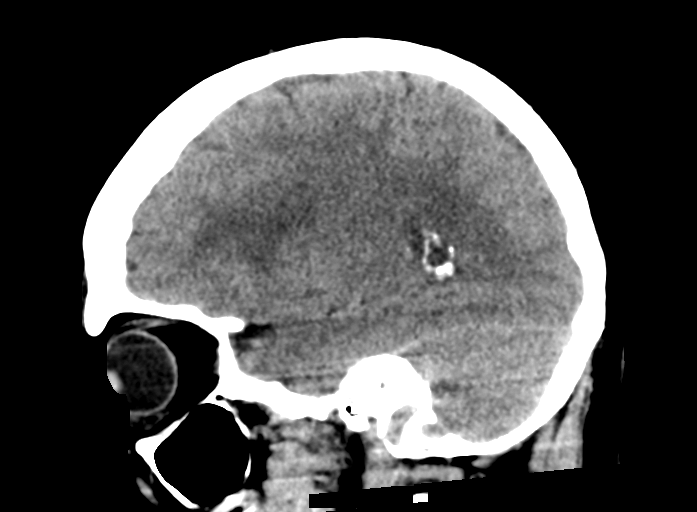

[16 of 47 positions shown; findings below may reference images not displayed]

FINDINGS: Brain: No acute territorial infarction, hemorrhage or intracranial
mass. Extensive hypodensity within the bilateral white matter,
significant progression since 4118. The ventricles are nonenlarged.

Vascular: No hyperdense vessels.  Carotid vascular calcification

Skull: Normal. Negative for fracture or focal lesion.

Sinuses/Orbits: Mucous retention cysts in the maxillary sinus.

Other: None
IMPRESSION: 1. No definite CT evidence for acute intracranial abnormality.
2. Marked bilateral white matter hypodensity consistent with
nonspecific white matter disease, potentially chronic small vessel
ischemic change

## 2020-10-13 ENCOUNTER — Telehealth: Payer: Medicaid Other

## 2020-11-20 ENCOUNTER — Ambulatory Visit: Payer: Medicaid Other | Admitting: Licensed Clinical Social Worker

## 2020-11-20 DIAGNOSIS — F319 Bipolar disorder, unspecified: Secondary | ICD-10-CM

## 2020-11-20 DIAGNOSIS — K219 Gastro-esophageal reflux disease without esophagitis: Secondary | ICD-10-CM

## 2020-11-20 DIAGNOSIS — Z8673 Personal history of transient ischemic attack (TIA), and cerebral infarction without residual deficits: Secondary | ICD-10-CM

## 2020-11-20 DIAGNOSIS — F1421 Cocaine dependence, in remission: Secondary | ICD-10-CM

## 2020-11-20 DIAGNOSIS — F209 Schizophrenia, unspecified: Secondary | ICD-10-CM

## 2020-11-20 DIAGNOSIS — J449 Chronic obstructive pulmonary disease, unspecified: Secondary | ICD-10-CM

## 2020-11-20 NOTE — Patient Instructions (Addendum)
Licensed Clinical Social Worker Visit Information  Goals we discussed today:   .  Client will talk with LCSW in next 30 days to discuss health needs of client and mental health needs of client (pt-stated)         CARE PLAN ENTRY   Current Barriers:   Patient with chronic diagnoses of GERD, HTN, hx of stroke, COPD, Bipolar I disorder, Hx coaine dependence, HLD, IBS, Schizophrenia  Clinical Social Work Clinical Goal(s):   LCSW to call client in next 30 days to discuss health needs of client and to discuss mental health needs of client  Interventions:  Talked with client about housing situation of client (rents apartment in [place], [place]. ) Talked with client about CCM program support Talked with client about migraines of client Talked with client about transport needs Talked with client about sleeping issues of client Talked with client about appetite of client Talked with client about RNCM support with Demetrios Loll RN Talked with client about pain issues of client  Patient Self Care Activities:   Completes ADLs independently Attends medical appointments   Patient Self Care Deficits:  Transport challenges Housing needs   Initial goal documentation       Follow Up Plan:LCSW to call client in next 4 weeks to talk with her about health needs of client and about mental health needs of client  Materials Provided: No  The patient verbalized understanding of instructions provided today and declined a print copy of patient instruction materials.   Kelton Pillar.Milliana Reddoch MSW, LCSW Licensed Clinical Social Worker [facility] Care Management [phone]

## 2020-11-20 NOTE — Chronic Care Management (AMB) (Cosign Needed)
Chronic Care Management    Clinical Social Work Follow Up Note  11/20/2020 Name: Karen Dougherty MRN: [id number] DOB: 05-07-1956  Karen Dougherty is a 65 y.o. year old female who is a primary care patient of Gwenlyn Fudge, FNP. The CCM team was consulted for assistance with [employer] .   Review of patient status, including review of consultants reports, other relevant assessments, and collaboration with appropriate care team members and the patient's provider was performed as part of comprehensive patient evaluation and provision of chronic care management services.    SDOH (Social Determinants of Health) assessments performed: No; risk for tobacco use; risk for depression; risk for stress; risk for financial strain  Flowsheet Row Chronic Care Management from 05/25/2020 in [facility]  PHQ-9 Total Score 5     GAD 7 : Generalized Anxiety Score 05/25/2020 05/10/2020  Nervous, Anxious, on Edge 1 0  Control/stop worrying 1 0  Worry too much - different things 0 0  Trouble relaxing 0 0  Restless 0 0  Easily annoyed or irritable 0 2  Afraid - awful might happen 0 2  Total GAD 7 Score 2 4  Anxiety Difficulty Somewhat difficult -    Outpatient Encounter Medications as of 11/20/2020  Medication Sig  . albuterol (VENTOLIN HFA) 108 (90 Base) MCG/ACT inhaler Inhale 2 puffs into the lungs every 6 (six) hours as needed for wheezing or shortness of breath.  . budesonide-formoterol (SYMBICORT) 160-4.5 MCG/ACT inhaler Inhale 2 puffs into the lungs 2 (two) times daily.  . enalapril (VASOTEC) 20 MG tablet Take 2 tablets (40 mg total) by mouth daily.  . famotidine (PEPCID) 20 MG tablet Take 1 tablet (20 mg total) by mouth 2 (two) times daily as needed for heartburn or indigestion.  . promethazine (PHENERGAN) 12.5 MG tablet Take 1 tablet (12.5 mg total) by mouth every 8 (eight) hours as needed for nausea or vomiting.  . simvastatin (ZOCOR) 20 MG tablet Take 1  tablet (20 mg total) by mouth at bedtime. (Patient taking differently: Take 20 mg by mouth daily. )  . SUMAtriptan (IMITREX) 50 MG tablet Take 1 tablet (50 mg total) by mouth every 2 (two) hours as needed for migraine. May repeat in 2 hours if headache persists or recurs.  . topiramate (TOPAMAX) 50 MG tablet Take 75 mg (1.5 tablets) by mouth in the AM with 50 mg (1 tablet) in the PM x1 week, then increase to 75 mg (1.5 tablets) twice daily.   No facility-administered encounter medications on file as of 11/20/2020.     Goals    .  Client will talk with LCSW in next 30 days to discuss health needs of client and mental health needs of client (pt-stated)      CARE PLAN ENTRY   Current Barriers:  . Patient with chronic diagnoses of GERD, HTN, hx of stroke, COPD, Bipolar I disorder, Hx coaine dependence, HLD, IBS, Schizophrenia  Clinical Social Work Clinical Goal(s):  Marland Kitchen LCSW to call client in next 30 days to discuss health needs of client and to discuss mental health needs of client  Interventions:  Talked with client about housing situation of client (rents apartment in [place], [place]. ) Talked with client about CCM program support Talked with client about migraines of client Talked with client about transport needs Talked with client about sleeping issues of client Talked with client about appetite of client Talked with client about RNCM support with Demetrios Loll RN Talked with  client about pain issues of client  Patient Self Care Activities:   Completes ADLs independently Attends medical appointments   Patient Self Care Deficits:  Transport challenges Housing needs   Initial goal documentation       Follow Up Plan: LCSW to call client in next 4 weeks to talk with her about health needs of client and about mental health needs of client  Kelton Pillar.Michelena Culmer MSW, LCSW Licensed Clinical Social Worker [facility] Care Management [phone]

## 2020-11-28 ENCOUNTER — Other Ambulatory Visit: Payer: Self-pay | Admitting: Family Medicine

## 2020-11-28 DIAGNOSIS — J449 Chronic obstructive pulmonary disease, unspecified: Secondary | ICD-10-CM

## 2020-12-22 ENCOUNTER — Ambulatory Visit: Payer: Medicaid Other | Admitting: Licensed Clinical Social Worker

## 2020-12-22 DIAGNOSIS — F209 Schizophrenia, unspecified: Secondary | ICD-10-CM

## 2020-12-22 DIAGNOSIS — F1421 Cocaine dependence, in remission: Secondary | ICD-10-CM

## 2020-12-22 DIAGNOSIS — K219 Gastro-esophageal reflux disease without esophagitis: Secondary | ICD-10-CM

## 2020-12-22 DIAGNOSIS — I10 Essential (primary) hypertension: Secondary | ICD-10-CM

## 2020-12-22 DIAGNOSIS — F319 Bipolar disorder, unspecified: Secondary | ICD-10-CM

## 2020-12-22 DIAGNOSIS — Z8673 Personal history of transient ischemic attack (TIA), and cerebral infarction without residual deficits: Secondary | ICD-10-CM

## 2020-12-22 DIAGNOSIS — E785 Hyperlipidemia, unspecified: Secondary | ICD-10-CM

## 2020-12-22 DIAGNOSIS — J449 Chronic obstructive pulmonary disease, unspecified: Secondary | ICD-10-CM

## 2020-12-22 NOTE — Patient Instructions (Addendum)
Licensed Clinical Actuary Provided: No   .  Client will talk with LCSW in next 30 days to discuss health needs of client and mental health needs of client (pt-stated)         CARE PLAN ENTRY   Current Barriers:   Patient with chronic diagnoses of GERD, HTN, hx of stroke, COPD, Bipolar I disorder, Hx coaine dependence, HLD, IBS, Schizophrenia  Clinical Social Work Clinical Goal(s):   LCSW to call client in next 30 days to discuss health needs of client and to discuss mental health needs of client  Interventions:  Talked with client about housing situation of client (rents apartment in [place], [place]. ) Talked with client about CCM program support Talked with client about migraines of client Talked with client about family support (brother in law is supportive) Talked with client about sleeping issues of client Talked with client about appetite of client Talked with client about RNCM support with Demetrios Loll RN Talked with client about pain issues of client  Patient Self Care Activities:   Completes ADLs independently Attends medical appointments   Patient Self Care Deficits:  Transport challenges Housing needs   Initial goal documentation       Follow Up Plan:LCSW to call client in next 4 weeks to talk with her about health needs of client and about mental health needs of client  The patient verbalized understanding of instructions provided today and declined a print copy of patient instruction materials.   Kelton Pillar.Viann Nielson MSW, LCSW Licensed Clinical Social Worker [facility] Care Management [phone]

## 2020-12-22 NOTE — Chronic Care Management (AMB) (Signed)
Chronic Care Management    Clinical Social Work Follow Up Note  12/22/2020 Name: Karen Dougherty MRN: [id number] DOB: [birth year]  Karen Dougherty is a 65 y.o. year old female who is a primary care patient of Gwenlyn Fudge, FNP. The CCM team was consulted for assistance with [employer] .   Review of patient status, including review of consultants reports, other relevant assessments, and collaboration with appropriate care team members and the patient's provider was performed as part of comprehensive patient evaluation and provision of chronic care management services.    SDOH (Social Determinants of Health) assessments performed: No; risk for depression; risk for tobacco use; risk for stress; risk for physical inactivity  Flowsheet Row Chronic Care Management from 05/25/2020 in [facility]  PHQ-9 Total Score 5     GAD 7 : Generalized Anxiety Score 05/25/2020 05/10/2020  Nervous, Anxious, on Edge 1 0  Control/stop worrying 1 0  Worry too much - different things 0 0  Trouble relaxing 0 0  Restless 0 0  Easily annoyed or irritable 0 2  Afraid - awful might happen 0 2  Total GAD 7 Score 2 4  Anxiety Difficulty Somewhat difficult -    Outpatient Encounter Medications as of 12/22/2020  Medication Sig  . albuterol (VENTOLIN HFA) 108 (90 Base) MCG/ACT inhaler Inhale 2 puffs into the lungs every 6 (six) hours as needed for wheezing or shortness of breath.  . enalapril (VASOTEC) 20 MG tablet Take 2 tablets (40 mg total) by mouth daily.  . famotidine (PEPCID) 20 MG tablet Take 1 tablet (20 mg total) by mouth 2 (two) times daily as needed for heartburn or indigestion.  . promethazine (PHENERGAN) 12.5 MG tablet Take 1 tablet (12.5 mg total) by mouth every 8 (eight) hours as needed for nausea or vomiting.  . simvastatin (ZOCOR) 20 MG tablet Take 1 tablet (20 mg total) by mouth at bedtime. (Patient taking differently: Take 20 mg by mouth daily. )  .  SUMAtriptan (IMITREX) 50 MG tablet Take 1 tablet (50 mg total) by mouth every 2 (two) hours as needed for migraine. May repeat in 2 hours if headache persists or recurs.  . SYMBICORT 160-4.5 MCG/ACT inhaler Inhale 2 puffs into the lungs 2 (two) times daily. (Needs to be seen before next refill)  . topiramate (TOPAMAX) 50 MG tablet Take 75 mg (1.5 tablets) by mouth in the AM with 50 mg (1 tablet) in the PM x1 week, then increase to 75 mg (1.5 tablets) twice daily.   No facility-administered encounter medications on file as of 12/22/2020.    Goals    .  Client will talk with LCSW in next 30 days to discuss health needs of client and mental health needs of client (pt-stated)      CARE PLAN ENTRY   Current Barriers:  . Patient with chronic diagnoses of GERD, HTN, hx of stroke, COPD, Bipolar I disorder, Hx coaine dependence, HLD, IBS, Schizophrenia  Clinical Social Work Clinical Goal(s):  Marland Kitchen LCSW to call client in next 30 days to discuss health needs of client and to discuss mental health needs of client  Interventions:  Talked with client about housing situation of client (rents apartment in [place], [place]. ) Talked with client about CCM program support Talked with client about migraines of client Talked with client about family support (brother in law is supportive) Talked with client about sleeping issues of client Talked with client about appetite of client Talked with  client about RNCM support with Demetrios Loll RN Talked with client about pain issues of client  Patient Self Care Activities:   Completes ADLs independently Attends medical appointments   Patient Self Care Deficits:  Transport challenges Housing needs   Initial goal documentation       Follow Up Plan:LCSW to call client in next 4 weeks to talk with her about health needs of client and about mental health needs of client  Kelton Pillar.Jeancarlos Marchena MSW, LCSW Licensed Clinical Social Worker [facility] Care Management [phone]

## 2020-12-26 ENCOUNTER — Other Ambulatory Visit: Payer: Self-pay | Admitting: Family Medicine

## 2020-12-26 DIAGNOSIS — J449 Chronic obstructive pulmonary disease, unspecified: Secondary | ICD-10-CM

## 2020-12-27 NOTE — Telephone Encounter (Signed)
NA - no VM - 2/23-jhb

## 2020-12-27 NOTE — Telephone Encounter (Signed)
Karen Dougherty NTBS 30 days given 11/28/20

## 2021-01-08 ENCOUNTER — Encounter: Payer: Self-pay | Admitting: Family Medicine

## 2021-01-23 ENCOUNTER — Telehealth: Payer: Medicaid Other

## 2021-03-01 ENCOUNTER — Telehealth: Payer: Medicaid Other

## 2021-04-09 ENCOUNTER — Ambulatory Visit: Payer: Medicaid Other | Admitting: Licensed Clinical Social Worker

## 2021-04-09 DIAGNOSIS — F1421 Cocaine dependence, in remission: Secondary | ICD-10-CM

## 2021-04-09 DIAGNOSIS — F419 Anxiety disorder, unspecified: Secondary | ICD-10-CM

## 2021-04-09 DIAGNOSIS — F209 Schizophrenia, unspecified: Secondary | ICD-10-CM

## 2021-04-09 DIAGNOSIS — I10 Essential (primary) hypertension: Secondary | ICD-10-CM

## 2021-04-09 DIAGNOSIS — E785 Hyperlipidemia, unspecified: Secondary | ICD-10-CM

## 2021-04-09 DIAGNOSIS — J449 Chronic obstructive pulmonary disease, unspecified: Secondary | ICD-10-CM

## 2021-04-09 DIAGNOSIS — F319 Bipolar disorder, unspecified: Secondary | ICD-10-CM

## 2021-04-09 NOTE — Chronic Care Management (AMB) (Signed)
Chronic Care Management    Clinical Social Work Note  04/09/2021 Name: Karen Dougherty MRN: [id number] DOB: July 03, 1956  Karen Dougherty is a 65 y.o. year old female who is a primary care patient of Gwenlyn Fudge, FNP. The CCM team was consulted to assist the patient with chronic disease management and/or care coordination needs related to: [employer] .   Engaged with patient/ daughter of patient, Karen Dougherty, by telephone for follow up visit in response to provider referral for social work chronic care management and care coordination services.   Consent to Services:  The patient was given information about Chronic Care Management services, agreed to services, and gave verbal consent prior to initiation of services.  Please see initial visit note for detailed documentation.   Patient agreed to services and consent obtained.   Assessment: Review of patient past medical history, allergies, medications, and health status, including review of relevant consultants reports was performed today as part of a comprehensive evaluation and provision of chronic care management and care coordination services.     SDOH (Social Determinants of Health) assessments and interventions performed:  SDOH Interventions   Flowsheet Row Most Recent Value  SDOH Interventions   Depression Interventions/Treatment  --  [informed client of LCSW support and of RNCM support]       Advanced Directives Status: See Vyn[employer] application for related entries.  CCM Care Plan  Allergies  Allergen Reactions  . Codeine Itching    HURTS IN HER CHEST   . Iodinated Diagnostic Agents     CAUSED SEIZURE ACTIVITY  . Latex Rash    Outpatient Encounter Medications as of 04/09/2021  Medication Sig  . albuterol (VENTOLIN HFA) 108 (90 Base) MCG/ACT inhaler Inhale 2 puffs into the lungs every 6 (six) hours as needed for wheezing or shortness of breath.  . enalapril (VASOTEC) 20 MG tablet Take 2 tablets (40 mg  total) by mouth daily.  . famotidine (PEPCID) 20 MG tablet Take 1 tablet (20 mg total) by mouth 2 (two) times daily as needed for heartburn or indigestion.  . promethazine (PHENERGAN) 12.5 MG tablet Take 1 tablet (12.5 mg total) by mouth every 8 (eight) hours as needed for nausea or vomiting.  . simvastatin (ZOCOR) 20 MG tablet Take 1 tablet (20 mg total) by mouth at bedtime. (Patient taking differently: Take 20 mg by mouth daily. )  . SUMAtriptan (IMITREX) 50 MG tablet Take 1 tablet (50 mg total) by mouth every 2 (two) hours as needed for migraine. May repeat in 2 hours if headache persists or recurs.  . SYMBICORT 160-4.5 MCG/ACT inhaler Inhale 2 puffs into the lungs 2 (two) times daily. (Needs to be seen before next refill)  . topiramate (TOPAMAX) 50 MG tablet Take 75 mg (1.5 tablets) by mouth in the AM with 50 mg (1 tablet) in the PM x1 week, then increase to 75 mg (1.5 tablets) twice daily.   No facility-administered encounter medications on file as of 04/09/2021.    Patient Active Problem List   Diagnosis Date Noted  . Chronic pain syndrome 09/05/2016  . History of cocaine dependence (HCC) 09/05/2016  . Gastroesophageal reflux 08/22/2016  . Osteoarthritis 08/22/2016  . Chronic hepatitis C without hepatic coma (HCC) 03/16/2015  . Tobacco use 01/21/2012  . Spinal stenosis of lumbar region 10/01/2011  . Bipolar 1 disorder (HCC) 06/12/2011  . COPD (chronic obstructive pulmonary disease) (HCC) 06/12/2011  . Essential hypertension with goal blood pressure less than 130/80 06/12/2011  . H/O:  stroke 06/12/2011  . Hyperlipidemia, unspecified 06/12/2011  . IBS (irritable bowel syndrome) 06/12/2011  . Migraine 06/12/2011  . Schizophrenia (HCC) 06/12/2011  . Seizure disorder (HCC) 06/12/2011    Conditions to be addressed/monitored: Monitor client management of anxiety and stress issues   Care Plan : LCSW Care Plan  Updates made by Isaiah Blakes, LCSW since 04/09/2021 12:00 AM     Problem: Emotional Distress     Goal: Emotional Health Supported;manage anxiety issues and stress issues   Start Date: 04/09/2021  Expected End Date: 07/10/2021  This Visit's Progress: On track  Priority: Medium  Note:   Current Barriers:  . Chronic Mental Health needs related to anxiety and stress issues . Mobility issues . Suicidal Ideation/Homicidal Ideation: No  Clinical Social Work Goal(s):  . patient will work with SW monthly by telephone or in person to reduce or manage symptoms related to anxiety and stress issues . patient will work with SW monthly to address concerns related to mobility issues and pain issues of client  Interventions: . 1:1 collaboration with Gwenlyn Fudge, FNP regarding development and update of comprehensive plan of care as evidenced by provider attestation and co-signature . Talked with Karen Dougherty, daughter of  client , about housing situation of client (client lives in apartment in [place], [place] . Talked with Gearldine Bienenstock about pain issues of client . Talked with Gearldine Bienenstock about client involvement with pain clinic (Bandy is not sure if client is still going to pain clinic or not) . Talked with Gearldine Bienenstock about transport needs of client . Talked with Gearldine Bienenstock about medication procurement of client . Talked with Gearldine Bienenstock about family support for client ( daughter is supportive; sister of client is supportive) . Talked with Gearldine Bienenstock about vision of client . Talked with Gearldine Bienenstock about upcoming client apartments . Talked with Gearldine Bienenstock about RNCM and LCSW support with CCM program . Talked with Gearldine Bienenstock about mobility of client  (client has a cane she uses, as needed, to help her walk)  Patient Self Care Activities:  . Self administers medications as prescribed . Completes ADLs as needed  Patient Coping Strengths:  . Family . Friends  Patient Self Care Deficits:  . Anxiety issues . Stress issues  Patient Goals:  - spend time or talk with others at least 2 to 3 times  per week - practice relaxation or meditation daily - keep a calendar with appointment dates  Follow Up Plan: LCSW to call client on 05/23/21     Kelton Pillar.Rayel Santizo MSW, LCSW Licensed Clinical Social Worker [facility] Care Management [phone]

## 2021-04-09 NOTE — Patient Instructions (Signed)
Visit Information  PATIENT GOALS: Goals Addressed            This Visit's Progress   . Manage My Emotions;Manage anxiety issues and stress issues faced       Timeframe:  Short-Term Goal Priority:  Medium Progress: On Track Start Date:             04/09/21                Expected End Date:           07/10/21            Follow Up Date 05/23/21   Manage My Emotions (Patient)  Manage anxiety issues and stress issues faced    Why is this important?    When you are stressed, down or upset, your body reacts too.   For example, your blood pressure may get higher; you may have a headache or stomachache.   When your emotions get the best of you, your body's ability to fight off cold and flu gets weak.   These steps will help you manage your emotions.    Patient Self Care Activities:  . Self administers medications as prescribed . Completes ADLs as needed  Patient Coping Strengths:  . Family . Friends  Patient Self Care Deficits:  . Anxiety issues . Stress issues  Patient Goals:  - spend time or talk with others at least 2 to 3 times per week - practice relaxation or meditation daily - keep a calendar with appointment dates  Follow Up Plan: LCSW to call client on 05/23/21       Kelton Pillar.Ulysee Fyock MSW, LCSW Licensed Clinical Social Worker [facility] Care Management [phone]

## 2021-05-23 ENCOUNTER — Ambulatory Visit: Payer: Medicaid Other | Admitting: Licensed Clinical Social Worker

## 2021-05-23 DIAGNOSIS — F209 Schizophrenia, unspecified: Secondary | ICD-10-CM

## 2021-05-23 DIAGNOSIS — I10 Essential (primary) hypertension: Secondary | ICD-10-CM

## 2021-05-23 DIAGNOSIS — E785 Hyperlipidemia, unspecified: Secondary | ICD-10-CM

## 2021-05-23 DIAGNOSIS — J449 Chronic obstructive pulmonary disease, unspecified: Secondary | ICD-10-CM

## 2021-05-23 DIAGNOSIS — K219 Gastro-esophageal reflux disease without esophagitis: Secondary | ICD-10-CM

## 2021-05-23 DIAGNOSIS — F419 Anxiety disorder, unspecified: Secondary | ICD-10-CM

## 2021-05-23 NOTE — Chronic Care Management (AMB) (Signed)
Chronic Care Management    Clinical Social Work Note  05/23/2021 Name: Karen Dougherty MRN: [id number] DOB: 1956/04/10  Karen Dougherty is a 65 y.o. year old female who is a primary care patient of Gwenlyn Fudge, FNP. The CCM team was consulted to assist the patient with chronic disease management and/or care coordination needs related to: [facility] .   Engaged with patient by telephone for follow up visit in response to provider referral for social work chronic care management and care coordination services.   Consent to Services:  The patient was given information about Chronic Care Management services, agreed to services, and gave verbal consent prior to initiation of services.  Please see initial visit note for detailed documentation.   Patient agreed to services and consent obtained.   Assessment: Review of patient past medical history, allergies, medications, and health status, including review of relevant consultants reports was performed today as part of a comprehensive evaluation and provision of chronic care management and care coordination services.     SDOH (Social Determinants of Health) assessments and interventions performed:  SDOH Interventions    Flowsheet Row Most Recent Value  SDOH Interventions   Depression Interventions/Treatment  --  [informed client of LCSW support and of RNCM support]        Advanced Directives Status: See Vynca application for related entries.  CCM Care Plan  Allergies  Allergen Reactions   Codeine Itching    HURTS IN HER CHEST    Iodinated Diagnostic Agents     CAUSED SEIZURE ACTIVITY   Latex Rash    Outpatient Encounter Medications as of 05/23/2021  Medication Sig   albuterol (VENTOLIN HFA) 108 (90 Base) MCG/ACT inhaler Inhale 2 puffs into the lungs every 6 (six) hours as needed for wheezing or shortness of breath.   enalapril (VASOTEC) 20 MG tablet Take 2 tablets (40 mg total) by mouth daily.   famotidine  (PEPCID) 20 MG tablet Take 1 tablet (20 mg total) by mouth 2 (two) times daily as needed for heartburn or indigestion.   promethazine (PHENERGAN) 12.5 MG tablet Take 1 tablet (12.5 mg total) by mouth every 8 (eight) hours as needed for nausea or vomiting.   simvastatin (ZOCOR) 20 MG tablet Take 1 tablet (20 mg total) by mouth at bedtime. (Patient taking differently: Take 20 mg by mouth daily. )   SUMAtriptan (IMITREX) 50 MG tablet Take 1 tablet (50 mg total) by mouth every 2 (two) hours as needed for migraine. May repeat in 2 hours if headache persists or recurs.   SYMBICORT 160-4.5 MCG/ACT inhaler Inhale 2 puffs into the lungs 2 (two) times daily. (Needs to be seen before next refill)   topiramate (TOPAMAX) 50 MG tablet Take 75 mg (1.5 tablets) by mouth in the AM with 50 mg (1 tablet) in the PM x1 week, then increase to 75 mg (1.5 tablets) twice daily.   No facility-administered encounter medications on file as of 05/23/2021.    Patient Active Problem List   Diagnosis Date Noted   Chronic pain syndrome 09/05/2016   History of cocaine dependence (HCC) 09/05/2016   Gastroesophageal reflux 08/22/2016   Osteoarthritis 08/22/2016   Chronic hepatitis C without hepatic coma (HCC) 03/16/2015   Tobacco use 01/21/2012   Spinal stenosis of lumbar region 10/01/2011   Bipolar 1 disorder (HCC) 06/12/2011   COPD (chronic obstructive pulmonary disease) (HCC) 06/12/2011   Essential hypertension with goal blood pressure less than 130/80 06/12/2011   H/O: stroke 06/12/2011  Hyperlipidemia, unspecified 06/12/2011   IBS (irritable bowel syndrome) 06/12/2011   Migraine 06/12/2011   Schizophrenia (HCC) 06/12/2011   Seizure disorder (HCC) 06/12/2011    Conditions to be addressed/monitored: monitor client management of anxiety and stress issues faced   Care Plan : LCSW Care Plan  Updates made by Isaiah Blakes, LCSW since 05/23/2021 12:00 AM     Problem: Emotional Distress      Goal: Emotional  Health Supported;manage anxiety issues and stress issues   Start Date: 05/23/2021  Expected End Date: 08/21/2021  This Visit's Progress: On track  Recent Progress: On track  Priority: Medium  Note:   Current Barriers:  Chronic Mental Health needs related to anxiety and stress issues Mobility issues Suicidal Ideation/Homicidal Ideation: No  Clinical Social Work Goal(s):  patient will work with SW monthly by telephone or in person to reduce or manage symptoms related to anxiety and stress issues patient will work with SW monthly to address concerns related to mobility issues and pain issues of client  Interventions: 1:1 collaboration with Gwenlyn Fudge, FNP regarding development and update of comprehensive plan of care as evidenced by provider attestation and co-signature Talked with client about her medical care received Client said she is trying to find a new medical provider at this time . She said she no longer lives in [place], [place] but now lives in [place], [place] Talked with client previously about housing needs of client  Talked with client about transport needs of client (client drives short distances as needed ) Client has support from her daughter, Gearldine Bienenstock  Patient Self Care Activities:  Self administers medications as prescribed Completes ADLs as needed  Patient Coping Strengths:  Family Friends  Patient Self Care Deficits:  Anxiety issues Stress issues  Patient Goals:  - spend time or talk with others at least 2 to 3 times per week - practice relaxation or meditation daily - keep a calendar with appointment dates  Follow Up Plan: LCSW to call client or daughter of client on 07/02/21      Kelton Pillar.Loic Hobin MSW, LCSW Licensed Clinical Social Worker [facility] Care Management [phone]

## 2021-05-23 NOTE — Patient Instructions (Signed)
Visit Information  PATIENT GOALS:  Goals Addressed             This Visit's Progress    Manage My Emotions;Manage anxiety issues and stress issues faced       Timeframe:  Short-Term Goal Priority:  Medium Progress: On Track Start Date:             05/23/21                Expected End Date:           08/31/21         Follow Up Date 07/02/21   Manage My Emotions (Patient)  Manage anxiety issues and stress issues faced    Why is this important?   When you are stressed, down or upset, your body reacts too.  For example, your blood pressure may get higher; you may have a headache or stomachache.  When your emotions get the best of you, your body's ability to fight off cold and flu gets weak.  These steps will help you manage your emotions.    Patient Self Care Activities:  Self administers medications as prescribed Completes ADLs as needed  Patient Coping Strengths:  Family Friends  Patient Self Care Deficits:  Anxiety issues Stress issues  Patient Goals:  - spend time or talk with others at least 2 to 3 times per week - practice relaxation or meditation daily - keep a calendar with appointment dates  Follow Up Plan: LCSW to call client or daughter of client on 07/02/21     Kelton Pillar.Tanise Russman MSW, LCSW Licensed Clinical Social Worker [facility] Care Management [phone]

## 2021-07-03 ENCOUNTER — Ambulatory Visit: Payer: Medicaid Other | Admitting: Licensed Clinical Social Worker

## 2021-07-03 DIAGNOSIS — F319 Bipolar disorder, unspecified: Secondary | ICD-10-CM

## 2021-07-03 DIAGNOSIS — J449 Chronic obstructive pulmonary disease, unspecified: Secondary | ICD-10-CM

## 2021-07-03 DIAGNOSIS — F209 Schizophrenia, unspecified: Secondary | ICD-10-CM

## 2021-07-03 DIAGNOSIS — F1421 Cocaine dependence, in remission: Secondary | ICD-10-CM

## 2021-07-03 DIAGNOSIS — I10 Essential (primary) hypertension: Secondary | ICD-10-CM

## 2021-07-03 DIAGNOSIS — F419 Anxiety disorder, unspecified: Secondary | ICD-10-CM

## 2021-07-03 DIAGNOSIS — K219 Gastro-esophageal reflux disease without esophagitis: Secondary | ICD-10-CM

## 2021-07-03 DIAGNOSIS — E785 Hyperlipidemia, unspecified: Secondary | ICD-10-CM

## 2021-07-03 NOTE — Patient Instructions (Signed)
Visit Information  PATIENT GOALS:  Goals Addressed             This Visit's Progress    Manage My Emotions;Manage anxiety issues and stress issues faced       Timeframe:  Short-Term Goal Priority:  Medium Progress: On Track Start Date:           07/03/21                Expected End Date:         10/01/21         Follow Up Date 08/15/21   Manage My Emotions (Patient)  Manage anxiety issues and stress issues faced    Why is this important?   When you are stressed, down or upset, your body reacts too.  For example, your blood pressure may get higher; you may have a headache or stomachache.  When your emotions get the best of you, your body's ability to fight off cold and flu gets weak.  These steps will help you manage your emotions.    Patient Self Care Activities:  Self administers medications as prescribed Completes ADLs as needed  Patient Coping Strengths:  Family Friends  Patient Self Care Deficits:  Anxiety issues Stress issues  Patient Goals:  - spend time or talk with others at least 2 to 3 times per week - practice relaxation or meditation daily - keep a calendar with appointment dates  Follow Up Plan: LCSW to call client or daughter of client on 08/15/21            Kelton Pillar.Rayshaun Needle MSW, LCSW Licensed Clinical Social Worker [facility] Care Management [phone]

## 2021-07-03 NOTE — Chronic Care Management (AMB) (Signed)
Chronic Care Management    Clinical Social Work Note  07/03/2021 Name: Ranata Laughery MRN: [id number] DOB: 1955-12-15  Vida Roller Cedar is a 65 y.o. year old female who is a primary care patient of Gwenlyn Fudge, FNP. The CCM team was consulted to assist the patient with chronic disease management and/or care coordination needs related to: [facility] .   Engaged with patient / Richrd Prime, daughter of patient, by telephone for follow up visit in response to provider referral for social work chronic care management and care coordination services.   Consent to Services:  The patient was given information about Chronic Care Management services, agreed to services, and gave verbal consent prior to initiation of services.  Please see initial visit note for detailed documentation.   Patient agreed to services and consent obtained.   Assessment: Review of patient past medical history, allergies, medications, and health status, including review of relevant consultants reports was performed today as part of a comprehensive evaluation and provision of chronic care management and care coordination services.     SDOH (Social Determinants of Health) assessments and interventions performed:  SDOH Interventions    Flowsheet Row Most Recent Value  SDOH Interventions   Stress Interventions Provide Counseling  [client has stress related to locating new housng accomodations]  Depression Interventions/Treatment  --  [previously informed client of LCSW support and of RNCM support]        Advanced Directives Status: See Vynca application for related entries.  CCM Care Plan  Allergies  Allergen Reactions   Codeine Itching    HURTS IN HER CHEST    Iodinated Diagnostic Agents     CAUSED SEIZURE ACTIVITY   Latex Rash    Outpatient Encounter Medications as of 07/03/2021  Medication Sig   albuterol (VENTOLIN HFA) 108 (90 Base) MCG/ACT inhaler Inhale 2 puffs into the lungs every 6  (six) hours as needed for wheezing or shortness of breath.   enalapril (VASOTEC) 20 MG tablet Take 2 tablets (40 mg total) by mouth daily.   famotidine (PEPCID) 20 MG tablet Take 1 tablet (20 mg total) by mouth 2 (two) times daily as needed for heartburn or indigestion.   promethazine (PHENERGAN) 12.5 MG tablet Take 1 tablet (12.5 mg total) by mouth every 8 (eight) hours as needed for nausea or vomiting.   simvastatin (ZOCOR) 20 MG tablet Take 1 tablet (20 mg total) by mouth at bedtime. (Patient taking differently: Take 20 mg by mouth daily. )   SUMAtriptan (IMITREX) 50 MG tablet Take 1 tablet (50 mg total) by mouth every 2 (two) hours as needed for migraine. May repeat in 2 hours if headache persists or recurs.   SYMBICORT 160-4.5 MCG/ACT inhaler Inhale 2 puffs into the lungs 2 (two) times daily. (Needs to be seen before next refill)   topiramate (TOPAMAX) 50 MG tablet Take 75 mg (1.5 tablets) by mouth in the AM with 50 mg (1 tablet) in the PM x1 week, then increase to 75 mg (1.5 tablets) twice daily.   No facility-administered encounter medications on file as of 07/03/2021.    Patient Active Problem List   Diagnosis Date Noted   Chronic pain syndrome 09/05/2016   History of cocaine dependence (HCC) 09/05/2016   Gastroesophageal reflux 08/22/2016   Osteoarthritis 08/22/2016   Chronic hepatitis C without hepatic coma (HCC) 03/16/2015   Tobacco use 01/21/2012   Spinal stenosis of lumbar region 10/01/2011   Bipolar 1 disorder (HCC) 06/12/2011   COPD (chronic obstructive  pulmonary disease) (HCC) 06/12/2011   Essential hypertension with goal blood pressure less than 130/80 06/12/2011   H/O: stroke 06/12/2011   Hyperlipidemia, unspecified 06/12/2011   IBS (irritable bowel syndrome) 06/12/2011   Migraine 06/12/2011   Schizophrenia (HCC) 06/12/2011   Seizure disorder (HCC) 06/12/2011    Conditions to be addressed/monitored: monitor client management of anxiety and stress issues faced  Care  Plan : LCSW Care Plan  Updates made by Isaiah Blakes, LCSW since 07/03/2021 12:00 AM     Problem: Emotional Distress      Goal: Emotional Health Supported;manage anxiety issues and stress issues   Start Date: 07/03/2021  Expected End Date: 10/01/2021  This Visit's Progress: On track  Recent Progress: On track  Priority: Medium  Note:   Current Barriers:  Chronic Mental Health needs related to anxiety and stress issues Mobility issues Suicidal Ideation/Homicidal Ideation: No  Clinical Social Work Goal(s):  patient will work with SW monthly by telephone or in person to reduce or manage symptoms related to anxiety and stress issues patient will work with SW monthly to address concerns related to mobility issues and pain issues of client  Interventions: 1:1 collaboration with Gwenlyn Fudge, FNP regarding development and update of comprehensive plan of care as evidenced by provider attestation and co-signature Talked with Richrd Prime, daughter of client , about housing needs of client Talked with client previously about housing needs of client  Talked with Gearldine Bienenstock about sleeping issues of client Talked with Gearldine Bienenstock about walking of client Talked with Gearldine Bienenstock about appetite of client Talked with Gearldine Bienenstock about fact that client now lived in [place], [place] and was trying to locate a new PCP.  She has previously been seeing Deliah Boston FNP for medical care at [facility].  Patient Self Care Activities:  Self administers medications as prescribed Completes ADLs as needed  Patient Coping Strengths:  Family Friends  Patient Self Care Deficits:  Anxiety issues Stress issues  Patient Goals:  - spend time or talk with others at least 2 to 3 times per week - practice relaxation or meditation daily - keep a calendar with appointment dates  Follow Up Plan: LCSW to call client or daughter of client on 08/15/21 to assess client needs     Kelton Pillar.Kemuel Buchmann MSW, LCSW Licensed Clinical  Social Worker [facility] Care Management [phone]

## 2021-08-15 ENCOUNTER — Telehealth: Payer: Medicaid Other

## 2021-09-03 ENCOUNTER — Telehealth: Payer: Self-pay

## 2021-09-03 DIAGNOSIS — Z5902 Unsheltered homelessness: Secondary | ICD-10-CM

## 2021-09-03 DIAGNOSIS — Z599 Problem related to housing and economic circumstances, unspecified: Secondary | ICD-10-CM

## 2021-09-03 NOTE — Telephone Encounter (Signed)
Transition Care Management Follow-up Telephone Call Date of discharge and from where: 08/31/21 - [facility] Diagnosis: COPD Exacerbation How have you been since you were released from the hospital? Better, still a little SOB - she is sleeping in her car - has applied for emergency housing Any questions or concerns? Yes - sleeping in car, nowhere to stay - I sent urgent referral to [facility]  Items Reviewed: Did the pt receive and understand the discharge instructions provided? Yes  Medications obtained and verified? Yes  Other? No  Any new allergies since your discharge? No  Dietary orders reviewed? Yes Do you have support at home? No   Home Care and Equipment/Supplies: Were home health services ordered? no If so, what is the name of the agency? N/a  Has the agency set up a time to come to the patient's home? not applicable Were any new equipment or medical supplies ordered?  No What is the name of the medical supply agency? N/a Were you able to get the supplies/equipment? not applicable Do you have any questions related to the use of the equipment or supplies? No  Functional Questionnaire: (I = Independent and D = Dependent) ADLs: I  Bathing/Dressing- I  Meal Prep- I  Eating- I  Maintaining continence- I  Transferring/Ambulation- I  Managing Meds- I  Follow up appointments reviewed:  PCP Hospital f/u appt confirmed? Yes  Scheduled to see Karen Dougherty on 09/06/21 @ 2:20. Specialist Hospital f/u appt confirmed? No   Are transportation arrangements needed? No  If their condition worsens, is the pt aware to call PCP or go to the Emergency Dept.? Yes Was the patient provided with contact information for the PCP's office or ED? Yes Was to pt encouraged to call back with questions or concerns? Yes

## 2021-09-05 ENCOUNTER — Telehealth: Payer: Self-pay

## 2021-09-05 NOTE — Chronic Care Management (AMB) (Signed)
  Chronic Care Management   Note  09/05/2021 Name: [name] MRN: [id number] DOB: 1956/01/17  Karen Dougherty is a 65 y.o. year old female who is a primary care patient of Gwenlyn Fudge, FNP. Karen Dougherty is currently enrolled in care management services. An additional referral for LCSW was placed.   Follow up plan: Unsuccessful telephone outreach attempt made. A HIPAA compliant phone message was left for the patient providing contact information and requesting a return call.  The care management team will reach out to the patient again over the next 7 days.  If patient returns call to provider office, please advise to call Embedded Care Management Care Guide Penne Lash  at [phone]  Penne Lash, RMA Care Guide, Embedded Care Coordination [facility]  [place], [place] [address] Direct Dial: [phone] [email or web address] Website: [email or web address]

## 2021-09-06 ENCOUNTER — Inpatient Hospital Stay: Payer: Medicaid Other | Admitting: Family Medicine

## 2021-09-07 ENCOUNTER — Encounter: Payer: Self-pay | Admitting: Family Medicine

## 2021-09-13 NOTE — Chronic Care Management (AMB) (Signed)
  Care Management   Note  09/13/2021 Name: Karen Dougherty MRN: [id number] DOB: 1956-06-12  Karen Dougherty is a 65 y.o. year old female who is a primary care patient of Gwenlyn Fudge, FNP and is actively engaged with the care management team. I reached out to Karen Dougherty by phone today to assist with scheduling a follow up visit with the Licensed Clinical Social Worker  Follow up plan: Unsuccessful telephone outreach attempt made.  The care management team will reach out to the patient again over the next 7 days.   Penne Lash, RMA Care Guide, Embedded Care Coordination [facility]  [place], [place] [address] Direct Dial: [phone] [email or web address] Website: [email or web address]

## 2021-09-20 NOTE — Chronic Care Management (AMB) (Signed)
  Chronic Care Management   Note  09/20/2021 Name: Karen Dougherty MRN: [id number] DOB: 04/27/1956  Karen Dougherty is a 65 y.o. year old female who is a primary care patient of Gwenlyn Fudge, FNP. Karen Dougherty is currently enrolled in care management services. An additional referral for LCSW was placed.   Follow up plan: Unable to make contact on outreach attempts x 3. PCP Gwenlyn Fudge, FNP notified via routed documentation in medical record.   Penne Lash, RMA Care Guide, Embedded Care Coordination [facility]  [place], [place] [address] Direct Dial: [phone] [email or web address] Website: [email or web address]

## 2021-10-09 ENCOUNTER — Telehealth: Payer: Medicaid Other

## 2021-10-17 ENCOUNTER — Telehealth: Payer: Medicaid Other

## 2021-12-13 ENCOUNTER — Telehealth: Payer: Self-pay | Admitting: Licensed Clinical Social Worker

## 2021-12-13 ENCOUNTER — Telehealth: Payer: Medicaid Other

## 2021-12-27 ENCOUNTER — Telehealth: Payer: Self-pay

## 2021-12-27 NOTE — Chronic Care Management (AMB) (Unsigned)
°  Care Management   Note  12/27/2021 Name: Karen Dougherty MRN: [id number] DOB: 10-05-1956  Karen Dougherty is a 66 y.o. year old female who is a primary care patient of Gwenlyn Fudge, FNP and is actively engaged with the care management team. I reached out to Karen Dougherty by phone today to assist with re-scheduling a follow up visit with the Licensed Clinical Social Worker  Follow up plan: Unsuccessful telephone outreach attempt made. A HIPAA compliant phone message was left for the patient providing contact information and requesting a return call.  The care management team will reach out to the patient again over the next 7 days.  If patient returns call to provider office, please advise to call Embedded Care Management Care Guide Karen Dougherty  at [phone]  Karen Dougherty, RMA Care Guide, Embedded Care Coordination [facility]  [place], [place] [address] Direct Dial: [phone] [email or web address] Website: [email or web address]

## 2022-01-03 NOTE — Chronic Care Management (AMB) (Signed)
?  Care Management  ? ?Note ? ?01/03/2022 ?Name: Karen Dougherty MRN: [id number] DOB: Dec 29, 1955 ? ?Karen Dougherty is a 66 y.o. year old female who is a primary care patient of Gwenlyn Fudge, FNP and is actively engaged with the care management team. I reached out to Vida Roller Henneke by phone today to assist with re-scheduling a follow up visit with the Licensed Clinical Social Worker ? ?Follow up plan: ?Unsuccessful telephone outreach attempt made. A HIPAA compliant phone message was left for the patient providing contact information and requesting a return call.  ?The care management team will reach out to the patient again over the next 7 days.  ?If patient returns call to provider office, please advise to call Embedded Care Management Care Guide Penne Lash  at [phone] ? ?Penne Lash, RMA ?Care Guide, Embedded Care Coordination ?[facility]  Care Management  ?[place], [place] [address] ?Direct Dial: [phone] ?[email or web address] ?Website: [email or web address]  ? ?

## 2022-01-22 NOTE — Chronic Care Management (AMB) (Signed)
?  Care Management  ? ?Note ? ?01/22/2022 ?Name: Karen Dougherty MRN: [id number] DOB: [birth year] ? ?Karen Dougherty is a 66 y.o. year old female who is a primary care patient of Gwenlyn Fudge, FNP and is actively engaged with the care management team. I reached out to Vida Roller Drennen by phone today to assist with re-scheduling a follow up visit with the Licensed Clinical Social Worker ? ?Follow up plan: ?Unable to make contact on outreach attempts x 3. PCP Gwenlyn Fudge, FNP notified via routed documentation in medical record.  ? ?Penne Lash, RMA ?Care Guide, Embedded Care Coordination ?  Care Management  ?[place], [place] [address] ?Direct Dial: [phone] ?[email or web address] ?Website: [email or web address]  ? ?

## 2022-09-04 ENCOUNTER — Telehealth: Payer: Self-pay

## 2022-09-04 NOTE — Telephone Encounter (Signed)
Transition Care Management Unsuccessful Follow-up Telephone Call  Date of discharge and from where:  09/02/22 [facility] ED  Attempts:  1st Attempt  Reason for unsuccessful TCM follow-up call:  Missing or invalid number - person that answered stated that we has the wrong # - mailed letter
